# Patient Record
Sex: Male | Born: 1990 | Race: White | Hispanic: No | Marital: Single | Smoking: Current some day smoker
Health system: Southern US, Community
[De-identification: ages and names within clinical notes are randomized; demographics above are authoritative.]

## PROBLEM LIST (undated history)

## (undated) ENCOUNTER — Ambulatory Visit (HOSPITAL_COMMUNITY): Admission: EM | Payer: Self-pay

## (undated) DIAGNOSIS — T50902A Poisoning by unspecified drugs, medicaments and biological substances, intentional self-harm, initial encounter: Secondary | ICD-10-CM

## (undated) DIAGNOSIS — F329 Major depressive disorder, single episode, unspecified: Secondary | ICD-10-CM

## (undated) DIAGNOSIS — I10 Essential (primary) hypertension: Secondary | ICD-10-CM

## (undated) DIAGNOSIS — Z720 Tobacco use: Secondary | ICD-10-CM

## (undated) DIAGNOSIS — Q613 Polycystic kidney, unspecified: Secondary | ICD-10-CM

## (undated) DIAGNOSIS — F32A Depression, unspecified: Secondary | ICD-10-CM

## (undated) HISTORY — PX: NO PAST SURGERIES: SHX2092

---
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
## 2013-05-01 ENCOUNTER — Emergency Department (HOSPITAL_COMMUNITY)
Admission: EM | Admit: 2013-05-01 | Discharge: 2013-05-01 | Disposition: A | Discharge status: Home / Self Care | Payer: Medicaid Other | Attending: Emergency Medicine | Admitting: Emergency Medicine

## 2013-05-01 ENCOUNTER — Encounter (HOSPITAL_COMMUNITY): Payer: Self-pay | Admitting: Emergency Medicine

## 2013-05-01 DIAGNOSIS — F17200 Nicotine dependence, unspecified, uncomplicated: Secondary | ICD-10-CM | POA: Insufficient documentation

## 2013-05-01 DIAGNOSIS — Y92410 Unspecified street and highway as the place of occurrence of the external cause: Secondary | ICD-10-CM | POA: Insufficient documentation

## 2013-05-01 DIAGNOSIS — Y9389 Activity, other specified: Secondary | ICD-10-CM | POA: Insufficient documentation

## 2013-05-01 DIAGNOSIS — Q613 Polycystic kidney, unspecified: Secondary | ICD-10-CM | POA: Insufficient documentation

## 2013-05-01 DIAGNOSIS — IMO0002 Reserved for concepts with insufficient information to code with codable children: Secondary | ICD-10-CM | POA: Insufficient documentation

## 2013-05-01 DIAGNOSIS — T07XXXA Unspecified multiple injuries, initial encounter: Secondary | ICD-10-CM

## 2013-05-01 HISTORY — DX: Polycystic kidney, unspecified: Q61.3
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
## 2013-05-01 NOTE — Discharge Instructions (Signed)
Take tylenol or motrin as needed for pain.  Apply ice to sore joints and heat or ice to sore muscles.  Return to the ER if you develop severe headache, neck/back/chest/abd pain, difficulty breathing, loss of control of bladder/bowels or extremity weakness/numbness.

## 2013-05-01 NOTE — ED Provider Notes (Signed)
Medical screening examination/treatment/procedure(s) were performed by non-physician practitioner and as supervising physician I was immediately available for consultation/collaboration.    ]  [place] L Jessic Standifer, MD 05/01/13 0621 
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
## 2013-05-01 NOTE — ED Notes (Signed)
Patient is alert and oriented x3.  He was given DC instructions and follow up visit instructions.  Patient gave verbal understanding.  He was DC ambulatory under his own power to home.  V/S stable.  He was not showing any signs of distress on DC 
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
## 2014-01-22 ENCOUNTER — Emergency Department (HOSPITAL_COMMUNITY)
Admission: EM | Admit: 2014-01-22 | Discharge: 2014-01-22 | Disposition: A | Discharge status: Home / Self Care | Payer: Medicaid Other | Attending: Emergency Medicine | Admitting: Emergency Medicine

## 2014-01-22 ENCOUNTER — Encounter (HOSPITAL_COMMUNITY): Payer: Self-pay | Admitting: Emergency Medicine

## 2014-01-22 ENCOUNTER — Emergency Department (HOSPITAL_COMMUNITY): Payer: Medicaid Other

## 2014-01-22 DIAGNOSIS — R109 Unspecified abdominal pain: Secondary | ICD-10-CM | POA: Diagnosis present

## 2014-01-22 DIAGNOSIS — Z720 Tobacco use: Secondary | ICD-10-CM | POA: Insufficient documentation

## 2014-01-22 DIAGNOSIS — N23 Unspecified renal colic: Secondary | ICD-10-CM | POA: Insufficient documentation

## 2014-01-22 DIAGNOSIS — Q613 Polycystic kidney, unspecified: Secondary | ICD-10-CM | POA: Diagnosis not present

## 2014-01-22 LAB — URINALYSIS, ROUTINE W REFLEX MICROSCOPIC
Bilirubin Urine: NEGATIVE
Glucose, UA: NEGATIVE mg/dL
Hgb urine dipstick: NEGATIVE
Ketones, ur: NEGATIVE mg/dL
Leukocytes, UA: NEGATIVE
Nitrite: NEGATIVE
Protein, ur: NEGATIVE mg/dL
Specific Gravity, Urine: 1.015 (ref 1.005–1.030)
Urobilinogen, UA: 0.2 mg/dL (ref 0.0–1.0)
pH: 7.5 (ref 5.0–8.0)

## 2014-01-22 LAB — CBC WITH DIFFERENTIAL/PLATELET
Basophils Absolute: 0.1 10*3/uL (ref 0.0–0.1)
Basophils Relative: 1 % (ref 0–1)
Eosinophils Absolute: 0.6 10*3/uL (ref 0.0–0.7)
Eosinophils Relative: 5 % (ref 0–5)
HCT: 46.8 % (ref 39.0–52.0)
Hemoglobin: 16.8 g/dL (ref 13.0–17.0)
Lymphocytes Relative: 53 % — ABNORMAL HIGH (ref 12–46)
Lymphs Abs: 6.5 10*3/uL — ABNORMAL HIGH (ref 0.7–4.0)
MCH: 32.1 pg (ref 26.0–34.0)
MCHC: 35.9 g/dL (ref 30.0–36.0)
MCV: 89.3 fL (ref 78.0–100.0)
Monocytes Absolute: 0.7 10*3/uL (ref 0.1–1.0)
Monocytes Relative: 6 % (ref 3–12)
Neutro Abs: 4.3 10*3/uL (ref 1.7–7.7)
Neutrophils Relative %: 35 % — ABNORMAL LOW (ref 43–77)
Platelets: 301 10*3/uL (ref 150–400)
RBC: 5.24 MIL/uL (ref 4.22–5.81)
RDW: 12 % (ref 11.5–15.5)
WBC: 12.2 10*3/uL — ABNORMAL HIGH (ref 4.0–10.5)

## 2014-01-22 LAB — BASIC METABOLIC PANEL
Anion gap: 10 (ref 5–15)
BUN: 21 mg/dL (ref 6–23)
CO2: 25 mmol/L (ref 19–32)
Calcium: 9.1 mg/dL (ref 8.4–10.5)
Chloride: 105 mEq/L (ref 96–112)
Creatinine, Ser: 1.32 mg/dL (ref 0.50–1.35)
GFR calc Af Amer: 88 mL/min — ABNORMAL LOW (ref >90)
GFR calc non Af Amer: 76 mL/min — ABNORMAL LOW (ref >90)
Glucose, Bld: 133 mg/dL — ABNORMAL HIGH (ref 70–99)
Potassium: 3.7 mmol/L (ref 3.5–5.1)
Sodium: 140 mmol/L (ref 135–145)

## 2014-01-22 LAB — PATHOLOGIST SMEAR REVIEW

## 2014-01-22 MED ORDER — KETOROLAC TROMETHAMINE 15 MG/ML IJ SOLN
15.0000 mg | Freq: Once | INTRAMUSCULAR | Status: AC
  Administered 2014-01-22: 15 mg via INTRAVENOUS
  Filled 2014-01-22: qty 1

## 2014-01-22 MED ORDER — HYDROMORPHONE HCL 1 MG/ML IJ SOLN
1.0000 mg | Freq: Once | INTRAMUSCULAR | Status: AC
  Administered 2014-01-22: 1 mg via INTRAVENOUS
  Filled 2014-01-22: qty 1

## 2014-01-22 MED ORDER — SODIUM CHLORIDE 0.9 % IV BOLUS (SEPSIS)
1000.0000 mL | Freq: Once | INTRAVENOUS | Status: AC
  Administered 2014-01-22: 1000 mL via INTRAVENOUS

## 2014-01-22 MED ORDER — ONDANSETRON HCL 4 MG PO TABS: 4.0000 mg | ORAL_TABLET | Freq: Four times a day (QID) | ORAL | Status: DC

## 2014-01-22 MED ORDER — ONDANSETRON HCL 4 MG/2ML IJ SOLN
4.0000 mg | Freq: Once | INTRAMUSCULAR | Status: AC
  Administered 2014-01-22: 4 mg via INTRAVENOUS
  Filled 2014-01-22: qty 2

## 2014-01-22 MED ORDER — OXYCODONE-ACETAMINOPHEN 5-325 MG PO TABS: 1.0000 | ORAL_TABLET | ORAL | Status: DC | PRN

## 2014-01-22 NOTE — ED Notes (Signed)
Pt c/o right lower abdominal pain starting this morning. Denies back pain. Denies pain radiates to his groin. Took pepto bismol this morning with no alleviation of symptoms. Emesis x2 today. First emesis occurrence patient reports light pink vomit from medicine. Second emesis occurrence reports bright red blood. Has autosomal dominant polycystic kidney disease. Normally takes Enalapril but hasn't had the medication for a while. Patient is pacing around the bed but not actively vomiting. No hx kidney stones. Still has gall bladder and appendix. No surgical history. No other questions/concerns. Awaiting MD/PA.

## 2014-01-22 NOTE — Discharge Instructions (Signed)
Ureteral Colic (Kidney Stones) °Ureteral colic is the result of a condition when kidney stones form inside the kidney. Once kidney stones are formed they may move into the tube that connects the kidney with the bladder (ureter). If this occurs, this condition may cause pain (colic) in the ureter.  °CAUSES  °Pain is caused by stone movement in the ureter and the obstruction caused by the stone. °SYMPTOMS  °The pain comes and goes as the ureter contracts around the stone. The pain is usually intense, sharp, and stabbing in character. The location of the pain may move as the stone moves through the ureter. When the stone is near the kidney the pain is usually located in the back and radiates to the belly (abdomen). When the stone is ready to pass into the bladder the pain is often located in the lower abdomen on the side the stone is located. At this location, the symptoms may mimic those of a urinary tract infection with urinary frequency. Once the stone is located here it often passes into the bladder and the pain disappears completely. °TREATMENT  °· Your caregiver will provide you with medicine for pain relief. °· You may require specialized follow-up X-rays. °· The absence of pain does not always mean that the stone has passed. It may have just stopped moving. If the urine remains completely obstructed, it can cause loss of kidney function or even complete destruction of the involved kidney. It is your responsibility and in your interest that X-rays and follow-ups as suggested by your caregiver are completed. Relief of pain without passage of the stone can be associated with severe damage to the kidney, including loss of kidney function on that side. °· If your stone does not pass on its own, additional measures may be taken by your caregiver to ensure its removal. °HOME CARE INSTRUCTIONS  °· Increase your fluid intake. Water is the preferred fluid since juices containing vitamin C may acidify the urine making it  less likely for certain stones (uric acid stones) to pass. °· Strain all urine. A strainer will be provided. Keep all particulate matter or stones for your caregiver to inspect. °· Take your pain medicine as directed. °· Make a follow-up appointment with your caregiver as directed. °· Remember that the goal is passage of your stone. The absence of pain does not mean the stone is gone. Follow your caregiver's instructions. °· Only take over-the-counter or prescription medicines for pain, discomfort, or fever as directed by your caregiver. °SEEK MEDICAL CARE IF:  °· Pain cannot be controlled with the prescribed medicine. °· You have a fever. °· Pain continues for longer than your caregiver advises it should. °· There is a change in the pain, and you develop chest discomfort or constant abdominal pain. °· You feel faint or pass out. °MAKE SURE YOU:  °· Understand these instructions. °· Will watch your condition. °· Will get help right away if you are not doing well or get worse. °Document Released: 10/01/2004 Document Revised: 04/18/2012 Document Reviewed: 06/18/2010 °[employer]® Patient Information ©2015 [employer]. This information is not intended to replace advice given to you by your health care provider. Make sure you discuss any questions you have with your health care provider. ° °

## 2014-01-22 NOTE — ED Provider Notes (Signed)
CSN: [id number]     Arrival date & time 01/22/14  1118 History   First MD Initiated Contact with Patient 01/22/14 1132     Chief Complaint  Patient presents with  . Abdominal Pain  . Hematemesis     (Consider location/radiation/quality/duration/timing/severity/associated sxs/prior Treatment) HPI   23yM with R sided abdominal/flank pain. Acute onset this morning. Pain sharp/severe. No appreciable exacerbating or relieving factors. Nausea and vomited x2. Small streaks of blood. No coffee grounds. Pain radiates into R testicle. No fever or chills. No hx of similar symptoms. No urinary complaints. Hx of polycystic kidneys.   Past Medical History  Diagnosis Date  . Polycystic kidney disease   . Polycystic kidney disease    History reviewed. No pertinent past surgical history. History reviewed. No pertinent family history. History  Substance Use Topics  . Smoking status: Current Every Day Smoker  . Smokeless tobacco: Not on file  . Alcohol Use: Yes     Comment: occ    Review of Systems All systems reviewed and negative, other than as noted in HPI.   Allergies  Ibuprofen  Home Medications   Prior to Admission medications   Medication Sig Start Date End Date Taking? Authorizing Provider  bismuth subsalicylate (PEPTO BISMOL) 262 MG/15ML suspension Take 30 mLs by mouth every 6 (six) hours as needed for indigestion or diarrhea or loose stools.   Yes Historical Provider, MD   BP 168/107 mmHg  Pulse 80  Temp(Src)   Resp 18  SpO2 100% Physical Exam  Constitutional: He appears well-developed and well-nourished. No distress.  standing uncomfortably beside bed when I entered room.  HENT:  Head: Normocephalic and atraumatic.  Eyes: Conjunctivae are normal. Right eye exhibits no discharge. Left eye exhibits no discharge.  Neck: Neck supple.  Cardiovascular: Normal rate, regular rhythm and normal heart sounds.  Exam reveals no gallop and no friction rub.   No murmur  heard. Pulmonary/Chest: Effort normal and breath sounds normal. No respiratory distress.  Abdominal: Soft. He exhibits no distension. There is no tenderness.  Musculoskeletal: He exhibits no edema or tenderness.  Neurological: He is alert.  Skin: Skin is warm and dry.  Psychiatric: He has a normal mood and affect. His behavior is normal. Thought content normal.  Nursing note and vitals reviewed.   ED Course  Procedures (including critical care time) Labs Review Labs Reviewed  CBC WITH DIFFERENTIAL - Abnormal; Notable for the following:    WBC 12.2 (*)    Neutrophils Relative % 35 (*)    Lymphocytes Relative 53 (*)    Lymphs Abs 6.5 (*)    All other components within normal limits  BASIC METABOLIC PANEL - Abnormal; Notable for the following:    Glucose, Bld 133 (*)    GFR calc non Af Amer 76 (*)    GFR calc Af Amer 88 (*)    All other components within normal limits  URINALYSIS, ROUTINE W REFLEX MICROSCOPIC - Abnormal; Notable for the following:    APPearance CLOUDY (*)    All other components within normal limits  PATHOLOGIST SMEAR REVIEW    Imaging Review Ct Abdomen Pelvis Wo Contrast  01/22/2014   CLINICAL DATA:  Initial encounter for right-sided abdominal pain since this morning. History of polycystic disease.  EXAM: CT ABDOMEN AND PELVIS WITHOUT CONTRAST  TECHNIQUE: Multidetector CT imaging of the abdomen and pelvis was performed following the standard protocol without IV contrast.  COMPARISON:  07/08/2007  FINDINGS: Lower chest:  Unremarkable.  Hepatobiliary: No  focal abnormality in the liver on this study without intravenous contrast. No evidence for hepatomegaly. There is no evidence for gallstones, gallbladder wall thickening, or pericholecystic fluid. No intrahepatic or extrahepatic biliary dilation.  Pancreas: No focal mass lesion. No dilatation of the main duct. No intraparenchymal cyst. No peripancreatic edema.  Spleen: No splenomegaly. No focal mass lesion.   Adrenals/Urinary Tract: No adrenal nodule or mass. 1 mm nonobstructing stone identified in the upper pole of the right kidney. Multiple water density lesions of varying sizes are seen in the kidneys bilaterally. The dominant lesion is again noted in the left kidney measuring approximately 5.1 x 6.2 cm today compared to 4.9 x 5.6 cm previously. On image 27 series 2, a 14 mm hyper attenuating lesion is seen in the interpolar left kidney which may be new since the prior study as it was not definitely seen previously. 2 mm stone is identified in the distal right ureter, 5-10 mm proximal to the right UVJ. No evidence for left ureteral stone. No bladder stone.  Stomach/Bowel: Stomach is nondistended. No gastric wall thickening. No evidence of outlet obstruction. Duodenum is normally positioned as is the ligament of Treitz. No small bowel wall thickening. No small bowel dilatation. Terminal ileum is normal. The appendix is normal. Diverticular changes are noted in the left colon without evidence of diverticulitis.  Vascular/Lymphatic: No abdominal aortic aneurysm. No gastrohepatic or hepatoduodenal ligament lymphadenopathy. No retroperitoneal lymphadenopathy. No evidence for pelvic lymphadenopathy.  Reproductive: Prostate gland and seminal vesicles are unremarkable.  Other: No intraperitoneal free fluid.  Musculoskeletal: Bone windows reveal no worrisome lytic or sclerotic osseous lesions.  IMPRESSION: 2 mm stone identified in distal right ureter, just proximal to the UVJ. This causes mild fullness in the right intrarenal collecting system and ureter without overt hydroureteronephrosis. Other tiny nonobstructing stone identified in the right kidney.  Bilateral renal cysts, seen previously. The largest cyst has increased in the interval. There is a 14 mm hyper attenuating lesion in the interpolar left kidney which is probably a cyst complicated by proteinaceous debris or hemorrhage, but cannot be definitively characterize  as such. Neoplasm would be unlikely in a patient of this age, but followup could be used to ensure stability. If clinically warranted, MRI without and with contrast could be used for definitive characterization.   Electronically Signed   By: Kennith CenterEric  Mansell M.D.   On: 01/22/2014 13:59     EKG Interpretation None      MDM   Final diagnoses:  Right flank pain    23yM with severe R flank pain. Suspect ureteral colic. Basic labs/UA. CT.   CT confirms suspicion. Symptoms now controlled. Continued pain/nausea meds. Avoid further NSAIDS with hx of polycystic kidneys. outpt urology FU as needed.     Raeford RazorStephen Alfonso Shackett, MD 01/23/14 1105

## 2014-05-10 ENCOUNTER — Encounter (HOSPITAL_COMMUNITY): Payer: Self-pay

## 2014-05-10 ENCOUNTER — Emergency Department (HOSPITAL_COMMUNITY)
Admission: EM | Admit: 2014-05-10 | Discharge: 2014-05-10 | Disposition: A | Discharge status: Home / Self Care | Payer: Medicaid Other | Attending: Emergency Medicine | Admitting: Emergency Medicine

## 2014-05-10 ENCOUNTER — Emergency Department (HOSPITAL_COMMUNITY): Payer: Medicaid Other

## 2014-05-10 DIAGNOSIS — Q613 Polycystic kidney, unspecified: Secondary | ICD-10-CM | POA: Insufficient documentation

## 2014-05-10 DIAGNOSIS — Z720 Tobacco use: Secondary | ICD-10-CM | POA: Insufficient documentation

## 2014-05-10 DIAGNOSIS — Z79899 Other long term (current) drug therapy: Secondary | ICD-10-CM | POA: Insufficient documentation

## 2014-05-10 DIAGNOSIS — M7712 Lateral epicondylitis, left elbow: Secondary | ICD-10-CM | POA: Insufficient documentation

## 2014-05-10 MED ORDER — OXYCODONE-ACETAMINOPHEN 5-325 MG PO TABS: 2.0000 | ORAL_TABLET | Freq: Once | ORAL | Status: DC

## 2014-05-10 MED ORDER — HYDROCODONE-ACETAMINOPHEN 5-325 MG PO TABS: 1.0000 | ORAL_TABLET | Freq: Four times a day (QID) | ORAL | Status: DC | PRN

## 2014-05-10 MED ORDER — HYDROCODONE-ACETAMINOPHEN 5-325 MG PO TABS
2.0000 | ORAL_TABLET | Freq: Once | ORAL | Status: AC
  Administered 2014-05-10: 2 via ORAL
  Filled 2014-05-10: qty 2

## 2014-05-10 NOTE — ED Provider Notes (Signed)
TIME SEEN: 8:05 AM  CHIEF COMPLAINT: Left arm pain  HPI: Pt is a 24 y.o. male with history of polycystic kidney disease who is right-hand-dominant who presents emergency department with left arm pain. Reports he woke up this morning with pain in his elbow that shoots up and down his arm. He denies any known injury but states he may have "twisted it wrong". Denies numbness, tingling or focal weakness. Is unable to extend his elbow because of pain. Several had similar symptoms. 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  ROS: See HPI Constitutional: no fever  Eyes: no drainage  ENT: no runny nose   Cardiovascular:  no chest pain  Resp: no SOB  GI: no vomiting GU: no dysuria Integumentary: no rash  Allergy: no hives  Musculoskeletal: no leg swelling  Neurological: no slurred speech ROS otherwise negative  PAST MEDICAL HISTORY/PAST SURGICAL HISTORY:  Past Medical History  Diagnosis Date  . Polycystic kidney disease   . Polycystic kidney disease     MEDICATIONS:  Prior to Admission medications   Medication Sig Start Date End Date Taking? Authorizing Provider  loratadine (CLARITIN) 10 MG tablet Take 10 mg by mouth daily.   Yes Historical Provider, MD  ondansetron (ZOFRAN) 4 MG tablet Take 1 tablet (4 mg total) by mouth every 6 (six) hours. Patient not taking: Reported on 05/10/2014 01/22/14   Raeford RazorStephen Kohut, MD  oxyCODONE-acetaminophen (PERCOCET/ROXICET) 5-325 MG per tablet Take 1-2 tablets by mouth every 4 (four) hours as needed for severe pain. Patient not taking: Reported on 05/10/2014 01/22/14   Raeford RazorStephen Kohut, MD    ALLERGIES:  Allergies  Allergen Reactions  . Ibuprofen Other (See Comments)    Kidney specialist advised him not to take it  . Nsaids     Kidney specialist advised him not to take it     SOCIAL HISTORY:  History  Substance Use Topics  . Smoking status: Current Some Day Smoker    Types: Cigarettes  . Smokeless tobacco: Never Used  . Alcohol Use: Yes     Comment: occ     FAMILY HISTORY: Family History  Problem Relation Age of Onset  . Family history unknown: Yes    EXAM: BP 140/97 mmHg  Pulse 92  Temp(Src) 97.4 F (36.3 C) (Oral)  Resp 16 CONSTITUTIONAL: Alert and oriented and responds appropriately to questions.  well-nourished, tearful, appears uncomfortable but is nontoxic HEAD: Normocephalic EYES: Conjunctivae clear, PERRL ENT: normal nose; no rhinorrhea; moist mucous membranes; pharynx without lesions noted NECK: Supple, no meningismus, no LAD  CARD: RRR; S1 and S2 appreciated; no murmurs, no clicks, no rubs, no gallops RESP: Normal chest excursion without splinting or tachypnea; breath sounds clear and equal bilaterally; no wheezes, no rhonchi, no rales, no hypoxia or respiratory distress, speaking full sentences ABD/GI: Normal bowel sounds; non-distended; soft, non-tender, no rebound, no guarding, no peritoneal signs BACK:  The back appears normal and is non-tender to palpation, there is no CVA tenderness EXT: Tender to palpation from the humerus to mid forearm without obvious bony deformity but there is swelling around the elbow without ecchymosis on the left side, no erythema or warmth, unable to fully extend the elbow and keeps it at 90 secondary to pain, no bony tenderness over the shoulder or left hand or wrist, 2+ radial pulses bilaterally, normal grip strength bilaterally, compartments are soft, otherwise Normal ROM in all joints; otherwise extremities are non-tender to palpation; no edema; normal capillary refill; no cyanosis, no calf tenderness or swelling  SKIN: Normal color for age and race; warm NEURO: Moves all extremities equally, sensation to light touch intact diffusely, cranial nerves II through XII intact PSYCH: The patient's mood and manner are appropriate. Grooming and personal hygiene are appropriate.  MEDICAL DECISION MAKING: Patient here with left sided arm pain. Denies any known injury but does have bony tenderness on  exam. Will obtain x-rays. No sign of infection, compartment syndrome. Norvasc intact distally. Suspect if x-rays are negative that this is likely tendinitis. Will treat with Vicodin. Will avoid NSAIDs given his history of polycystic kidney disease.  ED PROGRESS: Patient reports his pain is improved. He is now able to slightly extend his arm to 120 degrees.  On reexamination patient is now localized his tenderness to the lateral epicondyle. Suspect this is lateral epicondylitis. Patient unfortunately cannot take anti-inflammatories. We'll discharge with Vicodin and instructions for rest, ice and orthopedic follow-up if symptoms continue. We'll provide work note. Discussed return precautions. He verbalizes understanding and is comfortable with plan.     Layla MawKristen N Ahlana Slaydon, DO 05/10/14 [phone]

## 2014-05-10 NOTE — ED Notes (Signed)
Patient transported to X-ray 

## 2014-05-10 NOTE — Discharge Instructions (Signed)
Tennis Elbow Your caregiver has diagnosed you with a condition often referred to as "tennis elbow." This results from small tears or soreness (inflammation) at the start (origin) of the extensor muscles of the forearm. Although the condition is often called tennis or golfer's elbow, it is caused by any repetitive action performed by your elbow. HOME CARE INSTRUCTIONS  If the condition has been short lived, rest may be the only treatment required. Using your opposite hand or arm to perform the task may help. Even changing your grip may help rest the extremity. These may even prevent the condition from recurring.  Longer standing problems, however, will often be relieved faster by:  Using anti-inflammatory agents.  Applying ice packs for 30 minutes at the end of the working day, at bed time, or when activities are finished.  Your caregiver may also have you wear a splint or sling. This will allow the inflamed tendon to heal. At times, steroid injections aided with a local anesthetic will be required along with splinting for 1 to 2 weeks. Two to three steroid injections will often solve the problem. In some long standing cases, the inflamed tendon does not respond to conservative (non-surgical) therapy. Then surgery may be required to repair it. MAKE SURE YOU:   Understand these instructions.  Will watch your condition.  Will get help right away if you are not doing well or get worse. Document Released: 12/22/2004 Document Revised: 03/16/2011 Document Reviewed: 08/10/2007 [facility] Patient Information 2015 [place], [place]. This information is not intended to replace advice given to you by your health care provider. Make sure you discuss any questions you have with your health care provider.  Lateral Epicondylitis (Tennis Elbow) with Rehab Lateral epicondylitis involves inflammation and pain around the outer portion of the elbow. The pain is caused by inflammation of the tendons in the forearm  that bring back (extend) the wrist. Lateral epicondylitis is also called tennis elbow, because it is very common in tennis players. However, it may occur in any individual who extends the wrist repetitively. If lateral epicondylitis is left untreated, it may become a chronic problem. SYMPTOMS   Pain, tenderness, and inflammation on the outer (lateral) side of the elbow.  Pain or weakness with gripping activities.  Pain that increases with wrist-twisting motions (playing tennis, using a screwdriver, opening a door or a jar).  Pain with lifting objects, including a coffee cup. CAUSES  Lateral epicondylitis is caused by inflammation of the tendons that extend the wrist. Causes of injury may include:  Repetitive stress and strain on the muscles and tendons that extend the wrist.  Sudden change in activity level or intensity.  Incorrect grip in racquet sports.  Incorrect grip size of racquet (often too large).  Incorrect hitting position or technique (usually backhand, leading with the elbow).  Using a racket that is too heavy. RISK INCREASES WITH:  Sports or occupations that require repetitive and/or strenuous forearm and wrist movements (tennis, squash, racquetball, carpentry).  Poor wrist and forearm strength and flexibility.  Failure to warm up properly before activity.  Resuming activity before healing, rehabilitation, and conditioning are complete. PREVENTION   Warm up and stretch properly before activity.  Maintain physical fitness:  Strength, flexibility, and endurance.  Cardiovascular fitness.  Wear and use properly fitted equipment.  Learn and use proper technique and have a coach correct improper technique.  Wear a tennis elbow (counterforce) brace. PROGNOSIS  The course of this condition depends on the degree of the injury. If treated  properly, acute cases (symptoms lasting less than 4 weeks) are often resolved in 2 to 6 weeks. Chronic (longer lasting cases)  often resolve in 3 to 6 months but may require physical therapy. RELATED COMPLICATIONS   Frequently recurring symptoms, resulting in a chronic problem. Properly treating the problem the first time decreases frequency of recurrence.  Chronic inflammation, scarring tendon degeneration, and partial tendon tear, requiring surgery.  Delayed healing or resolution of symptoms. TREATMENT  Treatment first involves the use of ice and medicine to reduce pain and inflammation. Strengthening and stretching exercises may help reduce discomfort if performed regularly. These exercises may be performed at home if the condition is an acute injury. Chronic cases may require a referral to a physical therapist for evaluation and treatment. Your caregiver may advise a corticosteroid injection to help reduce inflammation. Rarely, surgery is needed. MEDICATION  If pain medicine is needed, nonsteroidal anti-inflammatory medicines (aspirin and ibuprofen), or other minor pain relievers (acetaminophen), are often advised.  Do not take pain medicine for 7 days before surgery.  Prescription pain relievers may be given, if your caregiver thinks they are needed. Use only as directed and only as much as you need.  Corticosteroid injections may be recommended. These injections should be reserved only for the most severe cases, because they can only be given a certain number of times. HEAT AND COLD  Cold treatment (icing) should be applied for 10 to 15 minutes every 2 to 3 hours for inflammation and pain, and immediately after activity that aggravates your symptoms. Use ice packs or an ice massage.  Heat treatment may be used before performing stretching and strengthening activities prescribed by your caregiver, physical therapist, or athletic trainer. Use a heat pack or a warm water soak. SEEK MEDICAL CARE IF: Symptoms get worse or do not improve in 2 weeks, despite treatment. EXERCISES  RANGE OF MOTION (ROM) AND  STRETCHING EXERCISES - Epicondylitis, Lateral (Tennis Elbow) These exercises may help you when beginning to rehabilitate your injury. Your symptoms may go away with or without further involvement from your physician, physical therapist, or athletic trainer. While completing these exercises, remember:   Restoring tissue flexibility helps normal motion to return to the joints. This allows healthier, less painful movement and activity.  An effective stretch should be held for at least 30 seconds.  A stretch should never be painful. You should only feel a gentle lengthening or release in the stretched tissue. RANGE OF MOTION - Wrist Flexion, Active-Assisted  Extend your right / left elbow with your fingers pointing down.*  Gently pull the back of your hand towards you, until you feel a gentle stretch on the top of your forearm.  Hold this position for __________ seconds. Repeat __________ times. Complete this exercise __________ times per day.  *If directed by your physician, physical therapist or athletic trainer, complete this stretch with your elbow bent, rather than extended. RANGE OF MOTION - Wrist Extension, Active-Assisted  Extend your right / left elbow and turn your palm upwards.*  Gently pull your palm and fingertips back, so your wrist extends and your fingers point more toward the ground.  You should feel a gentle stretch on the inside of your forearm.  Hold this position for __________ seconds. Repeat __________ times. Complete this exercise __________ times per day. *If directed by your physician, physical therapist or athletic trainer, complete this stretch with your elbow bent, rather than extended. STRETCH - Wrist Flexion  Place the back of your right / left  hand on a tabletop, leaving your elbow slightly bent. Your fingers should point away from your body.  Gently press the back of your hand down onto the table by straightening your elbow. You should feel a stretch on  the top of your forearm.  Hold this position for __________ seconds. Repeat __________ times. Complete this stretch __________ times per day.  STRETCH - Wrist Extension   Place your right / left fingertips on a tabletop, leaving your elbow slightly bent. Your fingers should point backwards.  Gently press your fingers and palm down onto the table by straightening your elbow. You should feel a stretch on the inside of your forearm.  Hold this position for __________ seconds. Repeat __________ times. Complete this stretch __________ times per day.  STRENGTHENING EXERCISES - Epicondylitis, Lateral (Tennis Elbow) These exercises may help you when beginning to rehabilitate your injury. They may resolve your symptoms with or without further involvement from your physician, physical therapist, or athletic trainer. While completing these exercises, remember:   Muscles can gain both the endurance and the strength needed for everyday activities through controlled exercises.  Complete these exercises as instructed by your physician, physical therapist or athletic trainer. Increase the resistance and repetitions only as guided.  You may experience muscle soreness or fatigue, but the pain or discomfort you are trying to eliminate should never worsen during these exercises. If this pain does get worse, stop and make sure you are following the directions exactly. If the pain is still present after adjustments, discontinue the exercise until you can discuss the trouble with your caregiver. STRENGTH - Wrist Flexors  Sit with your right / left forearm palm-up and fully supported on a table or countertop. Your elbow should be resting below the height of your shoulder. Allow your wrist to extend over the edge of the surface.  Loosely holding a __________ weight, or a piece of rubber exercise band or tubing, slowly curl your hand up toward your forearm.  Hold this position for __________ seconds. Slowly lower  the wrist back to the starting position in a controlled manner. Repeat __________ times. Complete this exercise __________ times per day.  STRENGTH - Wrist Extensors  Sit with your right / left forearm palm-down and fully supported on a table or countertop. Your elbow should be resting below the height of your shoulder. Allow your wrist to extend over the edge of the surface.  Loosely holding a __________ weight, or a piece of rubber exercise band or tubing, slowly curl your hand up toward your forearm.  Hold this position for __________ seconds. Slowly lower the wrist back to the starting position in a controlled manner. Repeat __________ times. Complete this exercise __________ times per day.  STRENGTH - Ulnar Deviators  Stand with a ____________________ weight in your right / left hand, or sit while holding a rubber exercise band or tubing, with your healthy arm supported on a table or countertop.  Move your wrist, so that your pinkie travels toward your forearm and your thumb moves away from your forearm.  Hold this position for __________ seconds and then slowly lower the wrist back to the starting position. Repeat __________ times. Complete this exercise __________ times per day STRENGTH - Radial Deviators  Stand with a ____________________ weight in your right / left hand, or sit while holding a rubber exercise band or tubing, with your injured arm supported on a table or countertop.  Raise your hand upward in front of you or pull up  on the rubber tubing.  Hold this position for __________ seconds and then slowly lower the wrist back to the starting position. Repeat __________ times. Complete this exercise __________ times per day. STRENGTH - Forearm Supinators   Sit with your right / left forearm supported on a table, keeping your elbow below shoulder height. Rest your hand over the edge, palm down.  Gently grip a hammer or a soup ladle.  Without moving your elbow, slowly turn  your palm and hand upward to a "thumbs-up" position.  Hold this position for __________ seconds. Slowly return to the starting position. Repeat __________ times. Complete this exercise __________ times per day.  STRENGTH - Forearm Pronators   Sit with your right / left forearm supported on a table, keeping your elbow below shoulder height. Rest your hand over the edge, palm up.  Gently grip a hammer or a soup ladle.  Without moving your elbow, slowly turn your palm and hand upward to a "thumbs-up" position.  Hold this position for __________ seconds. Slowly return to the starting position. Repeat __________ times. Complete this exercise __________ times per day.  STRENGTH - Grip  Grasp a tennis ball, a dense sponge, or a large, rolled sock in your hand.  Squeeze as hard as you can, without increasing any pain.  Hold this position for __________ seconds. Release your grip slowly. Repeat __________ times. Complete this exercise __________ times per day.  STRENGTH - Elbow Extensors, Isometric  Stand or sit upright, on a firm surface. Place your right / left arm so that your palm faces your stomach, and it is at the height of your waist.  Place your opposite hand on the underside of your forearm. Gently push up as your right / left arm resists. Push as hard as you can with both arms, without causing any pain or movement at your right / left elbow. Hold this stationary position for __________ seconds. Gradually release the tension in both arms. Allow your muscles to relax completely before repeating. Document Released: 12/22/2004 Document Revised: 05/08/2013 Document Reviewed: 04/05/2008 [facility] Patient Information 2015 [place], [place]. This information is not intended to replace advice given to you by your health care provider. Make sure you discuss any questions you have with your health care provider.  RICE: Routine Care for Injuries The routine care of many injuries includes Rest,  Ice, Compression, and Elevation (RICE). HOME CARE INSTRUCTIONS  Rest is needed to allow your body to heal. Routine activities can usually be resumed when comfortable. Injured tendons and bones can take up to 6 weeks to heal. Tendons are the cord-like structures that attach muscle to bone.  Ice following an injury helps keep the swelling down and reduces pain.  Put ice in a plastic bag.  Place a towel between your skin and the bag.  Leave the ice on for 15-20 minutes, 3-4 times a day, or as directed by your health care provider. Do this while awake, for the first 24 to 48 hours. After that, continue as directed by your caregiver.  Compression helps keep swelling down. It also gives support and helps with discomfort. If an elastic bandage has been applied, it should be removed and reapplied every 3 to 4 hours. It should not be applied tightly, but firmly enough to keep swelling down. Watch fingers or toes for swelling, bluish discoloration, coldness, numbness, or excessive pain. If any of these problems occur, remove the bandage and reapply loosely. Contact your caregiver if these problems continue.  Elevation  helps reduce swelling and decreases pain. With extremities, such as the arms, hands, legs, and feet, the injured area should be placed near or above the level of the heart, if possible. SEEK IMMEDIATE MEDICAL CARE IF:  You have persistent pain and swelling.  You develop redness, numbness, or unexpected weakness.  Your symptoms are getting worse rather than improving after several days. These symptoms may indicate that further evaluation or further X-rays are needed. Sometimes, X-rays may not show a small broken bone (fracture) until 1 week or 10 days later. Make a follow-up appointment with your caregiver. Ask when your X-ray results will be ready. Make sure you get your X-ray results. Document Released: 04/05/2000 Document Revised: 12/27/2012 Document Reviewed: 05/23/2010 [facility]  Patient Information 2015 [place], [place]. This information is not intended to replace advice given to you by your health care provider. Make sure you discuss any questions you have with your health care provider.

## 2014-05-10 NOTE — ED Notes (Signed)
Patient states that he woke at 0700 today and had left arm pain. Patient states he does not know what happened to his arm. Patient c/o pain in the left elbow area. Patient tearful, but very vague about injury.

## 2014-08-29 ENCOUNTER — Encounter (HOSPITAL_COMMUNITY): Payer: Self-pay | Admitting: *Deleted

## 2014-08-29 ENCOUNTER — Emergency Department (HOSPITAL_COMMUNITY)
Admission: EM | Admit: 2014-08-29 | Discharge: 2014-08-29 | Disposition: A | Discharge status: Home / Self Care | Payer: Medicaid Other | Attending: Emergency Medicine | Admitting: Emergency Medicine

## 2014-08-29 DIAGNOSIS — R1013 Epigastric pain: Secondary | ICD-10-CM

## 2014-08-29 DIAGNOSIS — Z79899 Other long term (current) drug therapy: Secondary | ICD-10-CM | POA: Insufficient documentation

## 2014-08-29 DIAGNOSIS — R1110 Vomiting, unspecified: Secondary | ICD-10-CM | POA: Insufficient documentation

## 2014-08-29 DIAGNOSIS — R197 Diarrhea, unspecified: Secondary | ICD-10-CM | POA: Insufficient documentation

## 2014-08-29 DIAGNOSIS — Q613 Polycystic kidney, unspecified: Secondary | ICD-10-CM | POA: Insufficient documentation

## 2014-08-29 DIAGNOSIS — Z720 Tobacco use: Secondary | ICD-10-CM | POA: Insufficient documentation

## 2014-08-29 LAB — DIFFERENTIAL
BASOS PCT: 0 % (ref 0–1)
Basophils Absolute: 0 10*3/uL (ref 0.0–0.1)
EOS ABS: 0 10*3/uL (ref 0.0–0.7)
EOS PCT: 0 % (ref 0–5)
LYMPHS ABS: 1.8 10*3/uL (ref 0.7–4.0)
Lymphocytes Relative: 12 % (ref 12–46)
MONOS PCT: 7 % (ref 3–12)
Monocytes Absolute: 1.2 10*3/uL — ABNORMAL HIGH (ref 0.1–1.0)
NEUTROS PCT: 81 % — AB (ref 43–77)
Neutro Abs: 12.4 10*3/uL — ABNORMAL HIGH (ref 1.7–7.7)

## 2014-08-29 LAB — COMPREHENSIVE METABOLIC PANEL
ALT: 24 U/L (ref 17–63)
AST: 21 U/L (ref 15–41)
Albumin: 4.9 g/dL (ref 3.5–5.0)
Alkaline Phosphatase: 55 U/L (ref 38–126)
Anion gap: 12 (ref 5–15)
BUN: 18 mg/dL (ref 6–20)
CO2: 27 mmol/L (ref 22–32)
Calcium: 10.5 mg/dL — ABNORMAL HIGH (ref 8.9–10.3)
Chloride: 99 mmol/L — ABNORMAL LOW (ref 101–111)
Creatinine, Ser: 0.86 mg/dL (ref 0.61–1.24)
GFR calc Af Amer: >60 mL/min (ref >60)
GFR calc non Af Amer: >60 mL/min (ref >60)
Glucose, Bld: 128 mg/dL — ABNORMAL HIGH (ref 65–99)
Potassium: 3.7 mmol/L (ref 3.5–5.1)
Sodium: 138 mmol/L (ref 135–145)
Total Bilirubin: 1.2 mg/dL (ref 0.3–1.2)
Total Protein: 8.1 g/dL (ref 6.5–8.1)

## 2014-08-29 LAB — CBC
HCT: 51 % (ref 39.0–52.0)
Hemoglobin: 18.6 g/dL — ABNORMAL HIGH (ref 13.0–17.0)
MCH: 32.1 pg (ref 26.0–34.0)
MCHC: 36.5 g/dL — ABNORMAL HIGH (ref 30.0–36.0)
MCV: 88.1 fL (ref 78.0–100.0)
Platelets: 306 10*3/uL (ref 150–400)
RBC: 5.79 MIL/uL (ref 4.22–5.81)
RDW: 11.9 % (ref 11.5–15.5)
WBC: 15.2 10*3/uL — ABNORMAL HIGH (ref 4.0–10.5)

## 2014-08-29 LAB — LIPASE, BLOOD: Lipase: 23 U/L (ref 22–51)

## 2014-08-29 MED ORDER — PROMETHAZINE HCL 25 MG RE SUPP: 25.0000 mg | Freq: Four times a day (QID) | RECTAL | Status: DC | PRN

## 2014-08-29 MED ORDER — GI COCKTAIL ~~LOC~~
30.0000 mL | Freq: Once | ORAL | Status: AC
  Administered 2014-08-29: 30 mL via ORAL
  Filled 2014-08-29: qty 30

## 2014-08-29 MED ORDER — MORPHINE SULFATE (PF) 4 MG/ML IV SOLN
4.0000 mg | Freq: Once | INTRAVENOUS | Status: AC
  Administered 2014-08-29: 4 mg via INTRAVENOUS
  Filled 2014-08-29: qty 1

## 2014-08-29 MED ORDER — ONDANSETRON HCL 4 MG/2ML IJ SOLN
4.0000 mg | Freq: Once | INTRAMUSCULAR | Status: AC | PRN
  Administered 2014-08-29: 4 mg via INTRAVENOUS
  Filled 2014-08-29: qty 2

## 2014-08-29 MED ORDER — HYDROCODONE-ACETAMINOPHEN 5-325 MG PO TABS: 1.0000 | ORAL_TABLET | ORAL | Status: DC | PRN

## 2014-08-29 NOTE — ED Notes (Signed)
Pt provided with pillow and blanket and reassured that MD would be around ASAP.

## 2014-08-29 NOTE — ED Notes (Signed)
Patient was alert, oriented and stable upon discharge. RN went over AVS and patient had no further questions.  

## 2014-08-29 NOTE — ED Notes (Signed)
Message left for PA that patient's pain has not changed after GI cocktail.

## 2014-08-29 NOTE — ED Provider Notes (Signed)
CSN: [id number]     Arrival date & time 08/29/14  1658 History   First MD Initiated Contact with Patient 08/29/14 2057     Chief Complaint  Patient presents with  . Abdominal Pain  . Emesis  . Diarrhea     (Consider location/radiation/quality/duration/timing/severity/associated sxs/prior Treatment) Patient is a 24 y.o. male presenting with abdominal pain, vomiting, and diarrhea. The history is provided by the patient. No language interpreter was used.  Abdominal Pain Pain location:  Epigastric Pain quality: sharp and stabbing   Pain radiates to:  Does not radiate Pain severity:  Moderate Onset quality:  Gradual Timing:  Intermittent Context: not alcohol use, not diet changes and not eating   Associated symptoms: diarrhea and vomiting   Associated symptoms: no chills and no fever   Associated symptoms comment:  Epigastric pain since yesterday no better with medications prescribed of Protonix and Zofran. No fever, hematemesis. It is no worse with eating. He denies previous similar symptoms. He denies alcohol use and does not smoke. No SOB, cough or chest pain.  Emesis Associated symptoms: abdominal pain and diarrhea   Associated symptoms: no chills   Diarrhea Associated symptoms: abdominal pain and vomiting   Associated symptoms: no chills and no fever     Past Medical History  Diagnosis Date  . Polycystic kidney disease   . Polycystic kidney disease    History reviewed. No pertinent past surgical history. Family History  Problem Relation Age of Onset  . Family history unknown: Yes   Social History  Substance Use Topics  . Smoking status: Current Some Day Smoker    Types: Cigarettes  . Smokeless tobacco: Never Used  . Alcohol Use: Yes     Comment: occ    Review of Systems  Constitutional: Negative for fever and chills.  HENT: Negative.   Respiratory: Negative.   Cardiovascular: Negative.   Gastrointestinal: Positive for vomiting, abdominal pain and diarrhea.   Musculoskeletal: Negative.   Skin: Negative.   Neurological: Negative.       Allergies  Ibuprofen and Nsaids  Home Medications   Prior to Admission medications   Medication Sig Start Date End Date Taking? Authorizing Provider  enalapril (VASOTEC) 5 MG tablet Take 5 mg by mouth daily. 05/22/14  Yes Historical Provider, MD  HYDROcodone-acetaminophen (NORCO/VICODIN) 5-325 MG per tablet Take 1-2 tablets by mouth every 6 (six) hours as needed. Patient not taking: Reported on 08/29/2014 05/10/14   Kristen N Ward, DO  ondansetron (ZOFRAN) 4 MG tablet Take 1 tablet (4 mg total) by mouth every 6 (six) hours. Patient not taking: Reported on 05/10/2014 01/22/14   Raeford Razor, MD  oxyCODONE-acetaminophen (PERCOCET/ROXICET) 5-325 MG per tablet Take 1-2 tablets by mouth every 4 (four) hours as needed for severe pain. Patient not taking: Reported on 05/10/2014 01/22/14   Raeford Razor, MD   BP 121/82 mmHg  Pulse 66  Temp(Src) 97.7 F (36.5 C) (Oral)  Resp 18  SpO2 99% Physical Exam  Constitutional: He is oriented to person, place, and time. He appears well-developed and well-nourished.  HENT:  Head: Normocephalic.  Neck: Normal range of motion. Neck supple.  Cardiovascular: Normal rate and regular rhythm.   Pulmonary/Chest: Effort normal and breath sounds normal.  Abdominal: Soft. Bowel sounds are normal. There is tenderness. There is no rebound and no guarding.  Tenderness limited to the epigastrium. Soft abdomen. No RUQ tenderness.   Musculoskeletal: Normal range of motion.  Neurological: He is alert and oriented to person, place, and time.  Skin: Skin is warm and dry. No rash noted.  Psychiatric: He has a normal mood and affect.    ED Course  Procedures (including critical care time) Labs Review Labs Reviewed  COMPREHENSIVE METABOLIC PANEL - Abnormal; Notable for the following:    Chloride 99 (*)    Glucose, Bld 128 (*)    Calcium 10.5 (*)    All other components within normal limits   CBC - Abnormal; Notable for the following:    WBC 15.2 (*)    Hemoglobin 18.6 (*)    MCHC 36.5 (*)    All other components within normal limits  DIFFERENTIAL - Abnormal; Notable for the following:    Neutrophils Relative % 81 (*)    Neutro Abs 12.4 (*)    Monocytes Absolute 1.2 (*)    All other components within normal limits  LIPASE, BLOOD  URINALYSIS, ROUTINE W REFLEX MICROSCOPIC (NOT AT [facility])    Imaging Review No results found. I have personally reviewed and evaluated these images and lab results as part of my medical decision-making.   EKG Interpretation None      MDM   Final diagnoses:  None    1. Epigastric pain  He feels better with Morphine and Zofran. Tolerating PO fluids, declines any solids. VSS in otherwise healthy 24-yo. No regular NSAID use. Will refer to GI for further evaluation of epigastric pain.    Elpidio Anis, PA-C 08/29/14 2247  Raeford Razor, MD 08/30/14 [phone]

## 2014-08-29 NOTE — ED Notes (Signed)
Pt info could not give urine sample at this time because he have yet to have anything to drink. Everything he had came right up.

## 2014-08-29 NOTE — Discharge Instructions (Signed)
CONTINUE TAKING PROTONIX PRESCRIBED BY YOUR DOCTOR. TAKE NORCO FOR PAIN AS NEEDED, PHENERGAN FOR NAUSEA. CLAL THE GASTROENTEROLOGY DOCTOR TO SCHEDULE AN APPOINTMENT FOR FURTHER EVALAUTION OF EPIGASTRIC PAIN.

## 2014-08-29 NOTE — ED Notes (Addendum)
Melvenia Beam, PA at bedside. Pt provided with ginger ale for PO challenge.

## 2014-08-29 NOTE — ED Notes (Addendum)
Pt complains of n/v/d/upper abdominal pain since yesterday morning. Pt went to his PCP and prescribed pantoprazole and zofran. Pt states he does not feel better and was told to come to ED.

## 2014-09-21 ENCOUNTER — Encounter (HOSPITAL_COMMUNITY): Payer: Self-pay | Admitting: Emergency Medicine

## 2014-09-21 ENCOUNTER — Emergency Department (HOSPITAL_COMMUNITY)
Admission: EM | Admit: 2014-09-21 | Discharge: 2014-09-21 | Disposition: A | Discharge status: Home / Self Care | Payer: Self-pay | Attending: Emergency Medicine | Admitting: Emergency Medicine

## 2014-09-21 ENCOUNTER — Emergency Department (HOSPITAL_COMMUNITY): Payer: Medicaid Other

## 2014-09-21 ENCOUNTER — Emergency Department (HOSPITAL_COMMUNITY): Payer: Self-pay

## 2014-09-21 DIAGNOSIS — Z720 Tobacco use: Secondary | ICD-10-CM | POA: Insufficient documentation

## 2014-09-21 DIAGNOSIS — Z79899 Other long term (current) drug therapy: Secondary | ICD-10-CM | POA: Insufficient documentation

## 2014-09-21 DIAGNOSIS — Y998 Other external cause status: Secondary | ICD-10-CM | POA: Insufficient documentation

## 2014-09-21 DIAGNOSIS — Y9289 Other specified places as the place of occurrence of the external cause: Secondary | ICD-10-CM | POA: Insufficient documentation

## 2014-09-21 DIAGNOSIS — R55 Syncope and collapse: Secondary | ICD-10-CM | POA: Insufficient documentation

## 2014-09-21 DIAGNOSIS — Y9389 Activity, other specified: Secondary | ICD-10-CM | POA: Insufficient documentation

## 2014-09-21 DIAGNOSIS — S060X9A Concussion with loss of consciousness of unspecified duration, initial encounter: Secondary | ICD-10-CM | POA: Insufficient documentation

## 2014-09-21 DIAGNOSIS — J069 Acute upper respiratory infection, unspecified: Secondary | ICD-10-CM | POA: Insufficient documentation

## 2014-09-21 DIAGNOSIS — R5383 Other fatigue: Secondary | ICD-10-CM | POA: Insufficient documentation

## 2014-09-21 DIAGNOSIS — W01198A Fall on same level from slipping, tripping and stumbling with subsequent striking against other object, initial encounter: Secondary | ICD-10-CM | POA: Insufficient documentation

## 2014-09-21 DIAGNOSIS — R51 Headache: Secondary | ICD-10-CM

## 2014-09-21 DIAGNOSIS — R519 Headache, unspecified: Secondary | ICD-10-CM

## 2014-09-21 DIAGNOSIS — R4182 Altered mental status, unspecified: Secondary | ICD-10-CM | POA: Insufficient documentation

## 2014-09-21 LAB — CBC WITH DIFFERENTIAL/PLATELET
BASOS PCT: 0 %
Basophils Absolute: 0 10*3/uL (ref 0.0–0.1)
Eosinophils Absolute: 0.1 10*3/uL (ref 0.0–0.7)
Eosinophils Relative: 1 %
HEMATOCRIT: 42.3 % (ref 39.0–52.0)
Hemoglobin: 14.9 g/dL (ref 13.0–17.0)
LYMPHS ABS: 1.5 10*3/uL (ref 0.7–4.0)
Lymphocytes Relative: 10 %
MCH: 31.3 pg (ref 26.0–34.0)
MCHC: 35.2 g/dL (ref 30.0–36.0)
MCV: 88.9 fL (ref 78.0–100.0)
MONO ABS: 1.4 10*3/uL — AB (ref 0.1–1.0)
Monocytes Relative: 9 %
Neutro Abs: 12.3 10*3/uL — ABNORMAL HIGH (ref 1.7–7.7)
Neutrophils Relative %: 80 %
Platelets: 223 10*3/uL (ref 150–400)
RBC: 4.76 MIL/uL (ref 4.22–5.81)
RDW: 12 % (ref 11.5–15.5)
WBC: 15.2 10*3/uL — ABNORMAL HIGH (ref 4.0–10.5)

## 2014-09-21 LAB — BASIC METABOLIC PANEL
Anion gap: 8 (ref 5–15)
BUN: 14 mg/dL (ref 6–20)
CO2: 26 mmol/L (ref 22–32)
CREATININE: 0.95 mg/dL (ref 0.61–1.24)
Calcium: 9.1 mg/dL (ref 8.9–10.3)
Chloride: 105 mmol/L (ref 101–111)
GFR calc non Af Amer: >60 mL/min (ref >60)
GLUCOSE: 107 mg/dL — AB (ref 65–99)
Potassium: 4.1 mmol/L (ref 3.5–5.1)
Sodium: 139 mmol/L (ref 135–145)

## 2014-09-21 MED ORDER — ACETAMINOPHEN 325 MG PO TABS
650.0000 mg | ORAL_TABLET | Freq: Once | ORAL | Status: AC
  Administered 2014-09-21: 650 mg via ORAL
  Filled 2014-09-21: qty 2

## 2014-09-21 NOTE — ED Provider Notes (Signed)
CSN: [id number]     Arrival date & time 09/21/14  1035 History   First MD Initiated Contact with Patient 09/21/14 1056     Chief Complaint  Patient presents with  . Head Injury  . Altered Mental Status   HPI  Mitchell Mcdonald is a 24 year old male with PMHx of polycystic kidney disease presenting with head injury and cough. Pt states he was getting out of his car 2 days ago when he hit his head on the frame of the door and lost consciousness. He is unsure for how long he was out but woke on the ground. States that he does not remember hitting his head but his girlfriend told him this is what happened. Pt reports he was intoxicated at the time. He reports that he has had a constant, throbbing generalized headache since this event. He is also complaining of feeling lightheaded when he walks. Pt is also complaining of congestion, cough, fatigue and body aches that started yesterday. He reports that he began coughing up green sputum with blood mixed in. He has felt feverish since yesterday. Denies sore throat but states sometimes it is painful when he swallows. He has not tried anything for symptom relief. Denies vision changes, drowsiness, gait instability, chest pain, SOB, wheezing, abdominal pain, nausea, vomiting, diarrhea, dysuria, joint aching, numbness or weakness in extremities.   Past Medical History  Diagnosis Date  . Polycystic kidney disease   . Polycystic kidney disease    History reviewed. No pertinent past surgical history. Family History  Problem Relation Age of Onset  . Family history unknown: Yes   Social History  Substance Use Topics  . Smoking status: Current Some Day Smoker    Types: Cigarettes  . Smokeless tobacco: Never Used  . Alcohol Use: Yes     Comment: occ    Review of Systems  Constitutional: Positive for fever, diaphoresis and fatigue. Negative for chills.  HENT: Positive for congestion. Negative for rhinorrhea, sore throat and trouble swallowing.   Eyes: Negative  for visual disturbance.  Respiratory: Positive for cough. Negative for shortness of breath and wheezing.   Cardiovascular: Negative for chest pain.  Gastrointestinal: Negative for nausea, vomiting, abdominal pain and diarrhea.  Genitourinary: Negative for dysuria.  Musculoskeletal: Positive for myalgias. Negative for back pain, arthralgias and gait problem.  Skin: Negative for wound.  Neurological: Positive for syncope, light-headedness and headaches. Negative for seizures and weakness.      Allergies  Ibuprofen and Nsaids  Home Medications   Prior to Admission medications   Medication Sig Start Date End Date Taking? Authorizing Provider  enalapril (VASOTEC) 5 MG tablet Take 5 mg by mouth daily. 05/22/14  Yes Historical Provider, MD  HYDROcodone-acetaminophen (NORCO/VICODIN) 5-325 MG per tablet Take 1-2 tablets by mouth every 4 (four) hours as needed. Patient not taking: Reported on 09/21/2014 08/29/14   Elpidio Anis, PA-C  ondansetron (ZOFRAN) 4 MG tablet Take 1 tablet (4 mg total) by mouth every 6 (six) hours. Patient not taking: Reported on 05/10/2014 01/22/14   Raeford Razor, MD  oxyCODONE-acetaminophen (PERCOCET/ROXICET) 5-325 MG per tablet Take 1-2 tablets by mouth every 4 (four) hours as needed for severe pain. Patient not taking: Reported on 05/10/2014 01/22/14   Raeford Razor, MD  promethazine (PHENERGAN) 25 MG suppository Place 1 suppository (25 mg total) rectally every 6 (six) hours as needed for nausea or vomiting. Patient not taking: Reported on 09/21/2014 08/29/14   Elpidio Anis, PA-C   BP 123/74 mmHg  Pulse  75  Temp(Src) 98.5 F (36.9 C) (Oral)  Resp 16  Ht  (1.803 m)  Wt 175 lb (79.379 kg)  BMI 24.42 kg/m2  SpO2 100% Physical Exam  Constitutional: He appears well-developed and well-nourished. No distress.  HENT:  Head: Normocephalic and atraumatic.  Mouth/Throat: Oropharynx is clear and moist. No oropharyngeal exudate.  TTP over occiput and top of head.  Eyes:  Conjunctivae and EOM are normal. Pupils are equal, round, and reactive to light. Right eye exhibits no discharge. Left eye exhibits no discharge. No scleral icterus.  Neck: Normal range of motion.  Cardiovascular: Normal rate and regular rhythm.   Pulmonary/Chest: Effort normal and breath sounds normal. No respiratory distress. He has no wheezes. He has no rales.  Pt breathing unlabored  Abdominal: Soft. He exhibits no distension. There is no tenderness. There is no rebound and no guarding.  Musculoskeletal: Normal range of motion.  Pt moves extremities spontaneously  Neurological: He is alert. No cranial nerve deficit. Coordination normal.  Cranial nerves 3-12 intact. 5/5 motor strength BUE and BLE. Sensation to light touch intact throughout. Heel to shin normal. Pt walks with steady gait.   Skin: Skin is warm and dry.  Psychiatric: He has a normal mood and affect. His behavior is normal.  Nursing note and vitals reviewed.   ED Course  Procedures (including critical care time) Labs Review Labs Reviewed  CBC WITH DIFFERENTIAL/PLATELET - Abnormal; Notable for the following:    WBC 15.2 (*)    Neutro Abs 12.3 (*)    Monocytes Absolute 1.4 (*)    All other components within normal limits  BASIC METABOLIC PANEL - Abnormal; Notable for the following:    Glucose, Bld 107 (*)    All other components within normal limits    Imaging Review Dg Chest 2 View  09/21/2014   CLINICAL DATA:  Fall, LOC 2 days ago was unconscious Headaches Spitting up blood Congestion Chest pain X smoker 5 months ago hypertension Fall, LOC  EXAM: CHEST  2 VIEW  COMPARISON:  01/07/2004  FINDINGS: Heart size and vascular pattern are normal. Lungs are clear. No pleural effusion.  Sternum not well evaluated but there is a suggestion of deformity of the mid sternum.  IMPRESSION: Evidence of deformity of the sternum of uncertain age. If there is pain in this area would recommend further evaluation. Fracture not excluded.    Electronically Signed   By: Esperanza Heir M.D.   On: 09/21/2014 12:51   Ct Head Wo Contrast  09/21/2014   CLINICAL DATA:  Generalized headaches, fall, recent loss of consciousness  EXAM: CT HEAD WITHOUT CONTRAST  TECHNIQUE: Contiguous axial images were obtained from the base of the skull through the vertex without contrast.  COMPARISON:  02/01/2009  FINDINGS: Normal appearance of the intracranial structures. No evidence for acute hemorrhage, mass lesion, midline shift, hydrocephalus or large infarct. No acute bony abnormality. The visualized sinuses are clear.  IMPRESSION: No acute intracranial abnormality.   Electronically Signed   By: Judie Petit.  Shick M.D.   On: 09/21/2014 13:24   I have personally reviewed and evaluated these images and lab results as part of my medical decision-making.   EKG Interpretation None      MDM   Final diagnoses:  Headache, unspecified headache type  URI (upper respiratory infection)   Pt presenting after head injury 2 days ago with throbbing headache and lightheadedness with walking. Pt also complaining of cough with green/bloody sputum, congestion, fevers and muscle aches that  started yesterday. VSS. Afebrile. Pt resting comfortably, not coughing, breathing unlabored. Lungs CTAB. Non-focal neuro exam. WBC elevated to 15.2. Pt CXR negative for acute infiltrate. Head CT negative for acute abnormality. Patients symptoms are consistent with URI, likely viral etiology. Discussed that antibiotics are not indicated for viral infections. Pt will be discharged with symptomatic treatment. Return precautions for headache given in discharge paperwork and discussed with pt. Verbalizes understanding and is agreeable with plan. Pt is hemodynamically stable & in NAD prior to dc.    Alveta Heimlich, PA-C 09/21/14 1536  Laurence Spates, MD 09/23/14 1314

## 2014-09-21 NOTE — ED Notes (Addendum)
Pt reports that he tripped and fell hitting his head against the car 2 days ago with complete loss of consciousness. Pt c/o throbbing pain to head. Pt with slow ambulation. Pt also reports that he is coughing up blood onset yesterday.

## 2014-09-21 NOTE — Discharge Instructions (Signed)
Headaches, Frequently Asked Questions °MIGRAINE HEADACHES °Q: What is migraine? What causes it? How can I treat it? °A: Generally, migraine headaches begin as a dull ache. Then they develop into a constant, throbbing, and pulsating pain. You may experience pain at the temples. You may experience pain at the front or back of one or both sides of the head. The pain is usually accompanied by a combination of: °· Nausea. °· Vomiting. °· Sensitivity to light and noise. °Some people (about 15%) experience an aura (see below) before an attack. The cause of migraine is believed to be chemical reactions in the brain. Treatment for migraine may include over-the-counter or prescription medications. It may also include self-help techniques. These include relaxation training and biofeedback.  °Q: What is an aura? °A: About 15% of people with migraine get an "aura". This is a sign of neurological symptoms that occur before a migraine headache. You may see wavy or jagged lines, dots, or flashing lights. You might experience tunnel vision or blind spots in one or both eyes. The aura can include visual or auditory hallucinations (something imagined). It may include disruptions in smell (such as strange odors), taste or touch. Other symptoms include: °· Numbness. °· A "pins and needles" sensation. °· Difficulty in recalling or speaking the correct word. °These neurological events may last as long as 60 minutes. These symptoms will fade as the headache begins. °Q: What is a trigger? °A: Certain physical or environmental factors can lead to or "trigger" a migraine. These include: °· Foods. °· Hormonal changes. °· Weather. °· Stress. °It is important to remember that triggers are different for everyone. To help prevent migraine attacks, you need to figure out which triggers affect you. Keep a headache diary. This is a good way to track triggers. The diary will help you talk to your healthcare professional about your condition. °Q: Does  weather affect migraines? °A: Bright sunshine, hot, humid conditions, and drastic changes in barometric pressure may lead to, or "trigger," a migraine attack in some people. But studies have shown that weather does not act as a trigger for everyone with migraines. °Q: What is the link between migraine and hormones? °A: Hormones start and regulate many of your body's functions. Hormones keep your body in balance within a constantly changing environment. The levels of hormones in your body are unbalanced at times. Examples are during menstruation, pregnancy, or menopause. That can lead to a migraine attack. In fact, about three quarters of all women with migraine report that their attacks are related to the menstrual cycle.  °Q: Is there an increased risk of stroke for migraine sufferers? °A: The likelihood of a migraine attack causing a stroke is very remote. That is not to say that migraine sufferers cannot have a stroke associated with their migraines. In persons under age 40, the most common associated factor for stroke is migraine headache. But over the course of a person's normal life span, the occurrence of migraine headache may actually be associated with a reduced risk of dying from cerebrovascular disease due to stroke.  °Q: What are acute medications for migraine? °A: Acute medications are used to treat the pain of the headache after it has started. Examples over-the-counter medications, NSAIDs, ergots, and triptans.  °Q: What are the triptans? °A: Triptans are the newest class of abortive medications. They are specifically targeted to treat migraine. Triptans are vasoconstrictors. They moderate some chemical reactions in the brain. The triptans work on receptors in your brain. Triptans help   to restore the balance of a neurotransmitter called serotonin. Fluctuations in levels of serotonin are thought to be a main cause of migraine.  °Q: Are over-the-counter medications for migraine effective? °A:  Over-the-counter, or "OTC," medications may be effective in relieving mild to moderate pain and associated symptoms of migraine. But you should see your caregiver before beginning any treatment regimen for migraine.  °Q: What are preventive medications for migraine? °A: Preventive medications for migraine are sometimes referred to as "prophylactic" treatments. They are used to reduce the frequency, severity, and length of migraine attacks. Examples of preventive medications include antiepileptic medications, antidepressants, beta-blockers, calcium channel blockers, and NSAIDs (nonsteroidal anti-inflammatory drugs). °Q: Why are anticonvulsants used to treat migraine? °A: During the past few years, there has been an increased interest in antiepileptic drugs for the prevention of migraine. They are sometimes referred to as "anticonvulsants". Both epilepsy and migraine may be caused by similar reactions in the brain.  °Q: Why are antidepressants used to treat migraine? °A: Antidepressants are typically used to treat people with depression. They may reduce migraine frequency by regulating chemical levels, such as serotonin, in the brain.  °Q: What alternative therapies are used to treat migraine? °A: The term "alternative therapies" is often used to describe treatments considered outside the scope of conventional Western medicine. Examples of alternative therapy include acupuncture, acupressure, and yoga. Another common alternative treatment is herbal therapy. Some herbs are believed to relieve headache pain. Always discuss alternative therapies with your caregiver before proceeding. Some herbal products contain arsenic and other toxins. °TENSION HEADACHES °Q: What is a tension-type headache? What causes it? How can I treat it? °A: Tension-type headaches occur randomly. They are often the result of temporary stress, anxiety, fatigue, or anger. Symptoms include soreness in your temples, a tightening band-like sensation  around your head (a "vice-like" ache). Symptoms can also include a pulling feeling, pressure sensations, and contracting head and neck muscles. The headache begins in your forehead, temples, or the back of your head and neck. Treatment for tension-type headache may include over-the-counter or prescription medications. Treatment may also include self-help techniques such as relaxation training and biofeedback. °CLUSTER HEADACHES °Q: What is a cluster headache? What causes it? How can I treat it? °A: Cluster headache gets its name because the attacks come in groups. The pain arrives with little, if any, warning. It is usually on one side of the head. A tearing or bloodshot eye and a runny nose on the same side of the headache may also accompany the pain. Cluster headaches are believed to be caused by chemical reactions in the brain. They have been described as the most severe and intense of any headache type. Treatment for cluster headache includes prescription medication and oxygen. °SINUS HEADACHES °Q: What is a sinus headache? What causes it? How can I treat it? °A: When a cavity in the bones of the face and skull (a sinus) becomes inflamed, the inflammation will cause localized pain. This condition is usually the result of an allergic reaction, a tumor, or an infection. If your headache is caused by a sinus blockage, such as an infection, you will probably have a fever. An x-ray will confirm a sinus blockage. Your caregiver's treatment might include antibiotics for the infection, as well as antihistamines or decongestants.  °REBOUND HEADACHES °Q: What is a rebound headache? What causes it? How can I treat it? °A: A pattern of taking acute headache medications too often can lead to a condition known as "rebound headache."   A pattern of taking too much headache medication includes taking it more than 2 days per week or in excessive amounts. That means more than the label or a caregiver advises. With rebound  headaches, your medications not only stop relieving pain, they actually begin to cause headaches. Doctors treat rebound headache by tapering the medication that is being overused. Sometimes your caregiver will gradually substitute a different type of treatment or medication. Stopping may be a challenge. Regularly overusing a medication increases the potential for serious side effects. Consult a caregiver if you regularly use headache medications more than 2 days per week or more than the label advises. ADDITIONAL QUESTIONS AND ANSWERS Q: What is biofeedback? A: Biofeedback is a self-help treatment. Biofeedback uses special equipment to monitor your body's involuntary physical responses. Biofeedback monitors:  Breathing.  Pulse.  Heart rate.  Temperature.  Muscle tension.  Brain activity. Biofeedback helps you refine and perfect your relaxation exercises. You learn to control the physical responses that are related to stress. Once the technique has been mastered, you do not need the equipment any more. Q: Are headaches hereditary? A: Four out of five (80%) of people that suffer report a family history of migraine. Scientists are not sure if this is genetic or a family predisposition. Despite the uncertainty, a child has a 50% chance of having migraine if one parent suffers. The child has a 75% chance if both parents suffer.  Q: Can children get headaches? A: By the time they reach high school, most young people have experienced some type of headache. Many safe and effective approaches or medications can prevent a headache from occurring or stop it after it has begun.  Q: What type of doctor should I see to diagnose and treat my headache? A: Start with your primary caregiver. Discuss his or her experience and approach to headaches. Discuss methods of classification, diagnosis, and treatment. Your caregiver may decide to recommend you to a headache specialist, depending upon your symptoms or other  physical conditions. Having diabetes, allergies, etc., may require a more comprehensive and inclusive approach to your headache. The National Head[employer] Foundation will provide, upon request, a list of [facility] physician members in your state. Document Released: 03/14/2003 Document Revised: 03/16/2011 Document Reviewed: 08/22/2007 [facility] Patient Information 2015 [place], [place]. This information is not intended to replace advice given to you by your health care provider. Make sure you discuss any questions you have with your health care provider.  Upper Respiratory Infection, Adult An upper respiratory infection (URI) is also sometimes known as the common cold. The upper respiratory tract includes the nose, sinuses, throat, trachea, and bronchi. Bronchi are the airways leading to the lungs. Most people improve within 1 week, but symptoms can last up to 2 weeks. A residual cough may last even longer.  CAUSES Many different viruses can infect the tissues lining the upper respiratory tract. The tissues become irritated and inflamed and often become very moist. Mucus production is also common. A cold is contagious. You can easily spread the virus to others by oral contact. This includes kissing, sharing a glass, coughing, or sneezing. Touching your mouth or nose and then touching a surface, which is then touched by another person, can also spread the virus. SYMPTOMS  Symptoms typically develop 1 to 3 days after you come in contact with a cold virus. Symptoms vary from person to person. They may include:  Runny nose.  Sneezing.  Nasal congestion.  Sinus irritation.  Sore throat.  Loss of  voice (laryngitis).  Cough.  Fatigue.  Muscle aches.  Loss of appetite.  Headache.  Low-grade fever. DIAGNOSIS  You might diagnose your own cold based on familiar symptoms, since most people get a cold 2 to 3 times a year. Your caregiver can confirm this based on your exam. Most importantly, your caregiver  can check that your symptoms are not due to another disease such as strep throat, sinusitis, pneumonia, asthma, or epiglottitis. Blood tests, throat tests, and X-rays are not necessary to diagnose a common cold, but they may sometimes be helpful in excluding other more serious diseases. Your caregiver will decide if any further tests are required. RISKS AND COMPLICATIONS  You may be at risk for a more severe case of the common cold if you smoke cigarettes, have chronic heart disease (such as heart failure) or lung disease (such as asthma), or if you have a weakened immune system. The very young and very old are also at risk for more serious infections. Bacterial sinusitis, middle ear infections, and bacterial pneumonia can complicate the common cold. The common cold can worsen asthma and chronic obstructive pulmonary disease (COPD). Sometimes, these complications can require emergency medical care and may be life-threatening. PREVENTION  The best way to protect against getting a cold is to practice good hygiene. Avoid oral or hand contact with people with cold symptoms. Wash your hands often if contact occurs. There is no clear evidence that vitamin C, vitamin E, echinacea, or exercise reduces the chance of developing a cold. However, it is always recommended to get plenty of rest and practice good nutrition. TREATMENT  Treatment is directed at relieving symptoms. There is no cure. Antibiotics are not effective, because the infection is caused by a virus, not by bacteria. Treatment may include:  Increased fluid intake. Sports drinks offer valuable electrolytes, sugars, and fluids.  Breathing heated mist or steam (vaporizer or shower).  Eating chicken soup or other clear broths, and maintaining good nutrition.  Getting plenty of rest.  Using gargles or lozenges for comfort.  Controlling fevers with ibuprofen or acetaminophen as directed by your caregiver.  Increasing usage of your inhaler if you  have asthma. Zinc gel and zinc lozenges, taken in the first 24 hours of the common cold, can shorten the duration and lessen the severity of symptoms. Pain medicines may help with fever, muscle aches, and throat pain. A variety of non-prescription medicines are available to treat congestion and runny nose. Your caregiver can make recommendations and may suggest nasal or lung inhalers for other symptoms.  HOME CARE INSTRUCTIONS   Only take over-the-counter or prescription medicines for pain, discomfort, or fever as directed by your caregiver.  Use a warm mist humidifier or inhale steam from a shower to increase air moisture. This may keep secretions moist and make it easier to breathe.  Drink enough water and fluids to keep your urine clear or pale yellow.  Rest as needed.  Return to work when your temperature has returned to normal or as your caregiver advises. You may need to stay home longer to avoid infecting others. You can also use a face mask and careful hand washing to prevent spread of the virus. SEEK MEDICAL CARE IF:   After the first few days, you feel you are getting worse rather than better.  You need your caregiver's advice about medicines to control symptoms.  You develop chills, worsening shortness of breath, or brown or red sputum. These may be signs of pneumonia.  You develop  yellow or brown nasal discharge or pain in the face, especially when you bend forward. These may be signs of sinusitis.  You develop a fever, swollen neck glands, pain with swallowing, or white areas in the back of your throat. These may be signs of strep throat. SEEK IMMEDIATE MEDICAL CARE IF:  General Headache Without Cause A headache is pain or discomfort felt around the head or neck area. The specific cause of a headache may not be found. There are many causes and types of headaches. A few common ones are:  Tension headaches.  Migraine headaches.  Cluster headaches.  Chronic daily  headaches. HOME CARE INSTRUCTIONS   Keep all follow-up appointments with your caregiver or any specialist referral.  Only take over-the-counter or prescription medicines for pain or discomfort as directed by your caregiver.  Lie down in a dark, quiet room when you have a headache.  Keep a headache journal to find out what may trigger your migraine headaches. For example, write down:  What you eat and drink.  How much sleep you get.  Any change to your diet or medicines.  Try massage or other relaxation techniques.  Put ice packs or heat on the head and neck. Use these 3 to 4 times per day for 15 to 20 minutes each time, or as needed.  Limit stress.  Sit up straight, and do not tense your muscles.  Quit smoking if you smoke.  Limit alcohol use.  Decrease the amount of caffeine you drink, or stop drinking caffeine.  Eat and sleep on a regular schedule.  Get 7 to 9 hours of sleep, or as recommended by your caregiver.  Keep lights dim if bright lights bother you and make your headaches worse. SEEK MEDICAL CARE IF:   You have problems with the medicines you were prescribed.  Your medicines are not working.  You have a change from the usual headache.  You have nausea or vomiting. SEEK IMMEDIATE MEDICAL CARE IF:   Your headache becomes severe.  You have a fever.  You have a stiff neck.  You have loss of vision.  You have muscular weakness or loss of muscle control.  You start losing your balance or have trouble walking.  You feel faint or pass out.  You have severe symptoms that are different from your first symptoms. MAKE SURE YOU:   Understand these instructions.  Will watch your condition.  Will get help right away if you are not doing well or get worse. Document Released: 12/22/2004 Document Revised: 03/16/2011 Document Reviewed: 01/07/2011 [facility] Patient Information 2015 [place], [place]. This information is not intended to replace advice given to  you by your health care provider. Make sure you discuss any questions you have with your health care provider.   You have a fever.  You develop severe or persistent headache, ear pain, sinus pain, or chest pain.  You develop wheezing, a prolonged cough, cough up blood, or have a change in your usual mucus (if you have chronic lung disease).  You develop sore muscles or a stiff neck. Document Released: 06/17/2000 Document Revised: 03/16/2011 Document Reviewed: 03/29/2013 [facility] Patient Information 2015 [place], [place]. This information is not intended to replace advice given to you by your health care provider. Make sure you discuss any questions you have with your health care provider.

## 2015-12-03 ENCOUNTER — Encounter (HOSPITAL_COMMUNITY): Payer: Self-pay | Admitting: Emergency Medicine

## 2015-12-03 ENCOUNTER — Emergency Department (HOSPITAL_COMMUNITY)
Admission: EM | Admit: 2015-12-03 | Discharge: 2015-12-04 | Disposition: A | Discharge status: Home / Self Care | Payer: Medicaid Other | Attending: Emergency Medicine | Admitting: Emergency Medicine

## 2015-12-03 ENCOUNTER — Emergency Department (HOSPITAL_COMMUNITY): Payer: Medicaid Other

## 2015-12-03 DIAGNOSIS — Y999 Unspecified external cause status: Secondary | ICD-10-CM | POA: Insufficient documentation

## 2015-12-03 DIAGNOSIS — Y929 Unspecified place or not applicable: Secondary | ICD-10-CM | POA: Insufficient documentation

## 2015-12-03 DIAGNOSIS — S61231A Puncture wound without foreign body of left index finger without damage to nail, initial encounter: Secondary | ICD-10-CM | POA: Insufficient documentation

## 2015-12-03 DIAGNOSIS — S61239A Puncture wound without foreign body of unspecified finger without damage to nail, initial encounter: Secondary | ICD-10-CM

## 2015-12-03 DIAGNOSIS — W458XXA Other foreign body or object entering through skin, initial encounter: Secondary | ICD-10-CM | POA: Insufficient documentation

## 2015-12-03 DIAGNOSIS — Y939 Activity, unspecified: Secondary | ICD-10-CM | POA: Insufficient documentation

## 2015-12-03 DIAGNOSIS — Z23 Encounter for immunization: Secondary | ICD-10-CM | POA: Insufficient documentation

## 2015-12-03 DIAGNOSIS — F17210 Nicotine dependence, cigarettes, uncomplicated: Secondary | ICD-10-CM | POA: Insufficient documentation

## 2015-12-03 NOTE — ED Triage Notes (Signed)
Pt from home with complaints of left hand pain following an injury where he tried to jump a chain link fence around 1500 today. Pt has a small puncture wound in the hand in his palm and a small abrasion. Pt is able to move his fingers, but with great difficulty. Pt states he is unsure when his last tetanus shot was. Pt rates his pain at 7/10. Pt's hand appears to be swollen. Pt is not febrile nor tachycardic

## 2015-12-04 MED ORDER — CEPHALEXIN 500 MG PO CAPS: 500.0000 mg | ORAL_CAPSULE | Freq: Three times a day (TID) | ORAL | 0 refills | Status: DC

## 2015-12-04 MED ORDER — TETANUS-DIPHTH-ACELL PERTUSSIS 5-2.5-18.5 LF-MCG/0.5 IM SUSP
0.5000 mL | Freq: Once | INTRAMUSCULAR | Status: AC
  Administered 2015-12-04: 0.5 mL via INTRAMUSCULAR
  Filled 2015-12-04: qty 0.5

## 2015-12-04 MED ORDER — BACITRACIN ZINC 500 UNIT/GM EX OINT
TOPICAL_OINTMENT | Freq: Every day | CUTANEOUS | Status: DC
Start: 2015-12-04 — End: 2015-12-04
  Administered 2015-12-04: 1 via TOPICAL
  Filled 2015-12-04: qty 0.9

## 2015-12-04 NOTE — ED Provider Notes (Signed)
[facility]-EMERGENCY DEPT Provider Note   CSN: [id number] Arrival date & time: 12/03/15  2215     History   Chief Complaint Chief Complaint  Patient presents with  . Hand Injury    HPI Mitchell Mcdonald is a 25 y.o. male.  HPI Mitchell Mcdonald is a 25 y.o. male with history of polycystic kidney disease, presents to emergency department complaining of left hand injury. Patient states he tried to jump over a chain link fence around 3 PM today and sustained a laceration a puncture to the left hand. Patient is complaining of swelling and pain to the hand. Tetanus is unknown. Pain and swelling to the hand. No treatment prior to coming in. Denies any other injuries. Denies any numbness or weakness distal to the injury. Pain is worsened by moving his thumb and index finger. Nothing making it better  Past Medical History:  Diagnosis Date  . Polycystic kidney disease   . Polycystic kidney disease     Patient Active Problem List   Diagnosis Date Noted  . POLYCYSTIC KIDNEY DISEASE 09/15/2006  . HYPERTENSION, BENIGN SYSTEMIC 03/04/2006  . RHINITIS, ALLERGIC 03/04/2006    History reviewed. No pertinent surgical history.     Home Medications    Prior to Admission medications   Medication Sig Start Date End Date Taking? Authorizing Provider  enalapril (VASOTEC) 5 MG tablet Take 5 mg by mouth daily. 05/22/14   Historical Provider, MD  HYDROcodone-acetaminophen (NORCO/VICODIN) 5-325 MG per tablet Take 1-2 tablets by mouth every 4 (four) hours as needed. Patient not taking: Reported on 09/21/2014 08/29/14   Elpidio AnisShari Upstill, PA-C  ondansetron (ZOFRAN) 4 MG tablet Take 1 tablet (4 mg total) by mouth every 6 (six) hours. Patient not taking: Reported on 05/10/2014 01/22/14   Raeford RazorStephen Kohut, MD  oxyCODONE-acetaminophen (PERCOCET/ROXICET) 5-325 MG per tablet Take 1-2 tablets by mouth every 4 (four) hours as needed for severe pain. Patient not taking: Reported on 05/10/2014 01/22/14   Raeford RazorStephen Kohut, MD  promethazine  (PHENERGAN) 25 MG suppository Place 1 suppository (25 mg total) rectally every 6 (six) hours as needed for nausea or vomiting. Patient not taking: Reported on 09/21/2014 08/29/14   Elpidio AnisShari Upstill, PA-C    Family History Family History  Problem Relation Age of Onset  . Family history unknown: Yes    Social History Social History  Substance Use Topics  . Smoking status: Current Some Day Smoker    Types: Cigarettes  . Smokeless tobacco: Never Used  . Alcohol use Yes     Comment: occ     Allergies   Ibuprofen and Nsaids   Review of Systems Review of Systems  Constitutional: Negative for chills and fever.  Musculoskeletal: Positive for arthralgias.  Skin: Positive for wound.  Neurological: Negative for weakness and numbness.     Physical Exam Updated Vital Signs BP 139/99   Pulse 85   Temp 98.9 F (37.2 C) (Oral)   Resp 16   Ht 5\' 10"  (1.778 m)   Wt 75.8 kg   SpO2 99%   BMI 23.96 kg/m   Physical Exam  Constitutional: He appears well-developed and well-nourished. No distress.  Neck: Neck supple.  Cardiovascular: Normal rate, regular rhythm and normal heart sounds.   Pulmonary/Chest: Effort normal and breath sounds normal. No respiratory distress. He has no wheezes.  Musculoskeletal:  Small puncture wound and superficial abrasion noted to the thenar eminence of the left hand. There is significant swelling to the left thenar eminence. Full range of motion of all  fingers. Pain with flexion of the thumb and index finger of the left hand. The distal radial pulses intact. Cap refill less than 2 seconds distally in all fingertips. No active bleeding.  Skin: Skin is warm.  Nursing note and vitals reviewed.    ED Treatments / Results  Labs (all labs ordered are listed, but only abnormal results are displayed) Labs Reviewed - No data to display  EKG  EKG Interpretation None       Radiology Dg Hand Complete Left  Result Date: 12/03/2015 CLINICAL DATA:  Jumped  over fence, with injury to the left hand. Palmar soft tissue wound. Initial encounter. EXAM: LEFT HAND - COMPLETE 3+ VIEW COMPARISON:  Left forearm radiographs performed 05/10/2014 FINDINGS: There is no evidence of fracture or dislocation. The joint spaces are preserved. The carpal rows are intact, and demonstrate normal alignment. The soft tissues are unremarkable in appearance Soft tissue swelling is noted at the thenar eminence, with scattered soft tissue air. No radiopaque foreign bodies are seen. IMPRESSION: 1. No evidence of fracture or dislocation. 2. Soft tissue swelling at the thenar eminence, with scattered soft tissue air. No radiopaque foreign bodies seen. Electronically Signed   By: Roanna RaiderJeffery  Chang M.D.   On: 12/03/2015 23:48    Procedures Procedures (including critical care time)  Medications Ordered in ED Medications  Tdap (BOOSTRIX) injection 0.5 mL (not administered)  bacitracin ointment (not administered)     Initial Impression / Assessment and Plan / ED Course  I have reviewed the triage vital signs and the nursing notes.  Pertinent labs & imaging results that were available during my care of the patient were reviewed by me and considered in my medical decision making (see chart for details).  Clinical Course   Patient in emergency department with small puncture wound to the thenar eminence of the left hand. Patient washed his hand after the injury, and the wound was clean and again here by RN. X-ray showing soft tissue swelling and scattered soft tissue air. At this time no evidence of infection, injury just a few hours ago. Will treat with topical antibiotic ointment, Ace wrap for swelling, home with Keflex, ice, elevation, Tylenol for pain. Patient has polycystic kidney disease, NSAIDs are contraindicated. Follow-up with family doctor as needed.   Vitals:   12/03/15 2217 12/03/15 2221  BP: (!) 137/101 139/99  Pulse: 85   Resp: 16   Temp: 98.9 F (37.2 C)   TempSrc:  Oral   SpO2: 99%   Weight: 75.8 kg   Height: 5\' 10"  (1.778 m)     Final Clinical Impressions(s) / ED Diagnoses   Final diagnoses:  Puncture wound of finger of left hand, initial encounter    New Prescriptions New Prescriptions   CEPHALEXIN (KEFLEX) 500 MG CAPSULE    Take 1 capsule (500 mg total) by mouth 3 (three) times daily.     Jaynie Crumbleatyana Treniece Holsclaw, PA-C 12/04/15 0030    Tomasita CrumbleAdeleke Oni, MD 12/04/15 [phone]

## 2015-12-04 NOTE — Discharge Instructions (Signed)
Keep your hand clean. Bacitracin twice a day to the puncture wound. Ace wrap for swelling as needed. Ice and elevate her hand several times a day. Take Keflex as prescribed until all gone to prevent infection. Tylenol for pain. Follow-up with family doctor as needed for recheck. Return if worsening swelling, redness, drainage from the wound.

## 2015-12-04 NOTE — ED Notes (Signed)
Patient d/c'd self care.  F/U and medications reviewed.  Patient verbalized understanding. 

## 2016-01-17 IMAGING — CR DG FOREARM 2V*L*
1 series · 1 of 1 positions shown · non-contrast
Comparison: None.

CLINICAL DATA: Left arm pain, no known injury, initial encounter

EXAM:
LEFT FOREARM - 2 VIEW

[x forearm lat left]
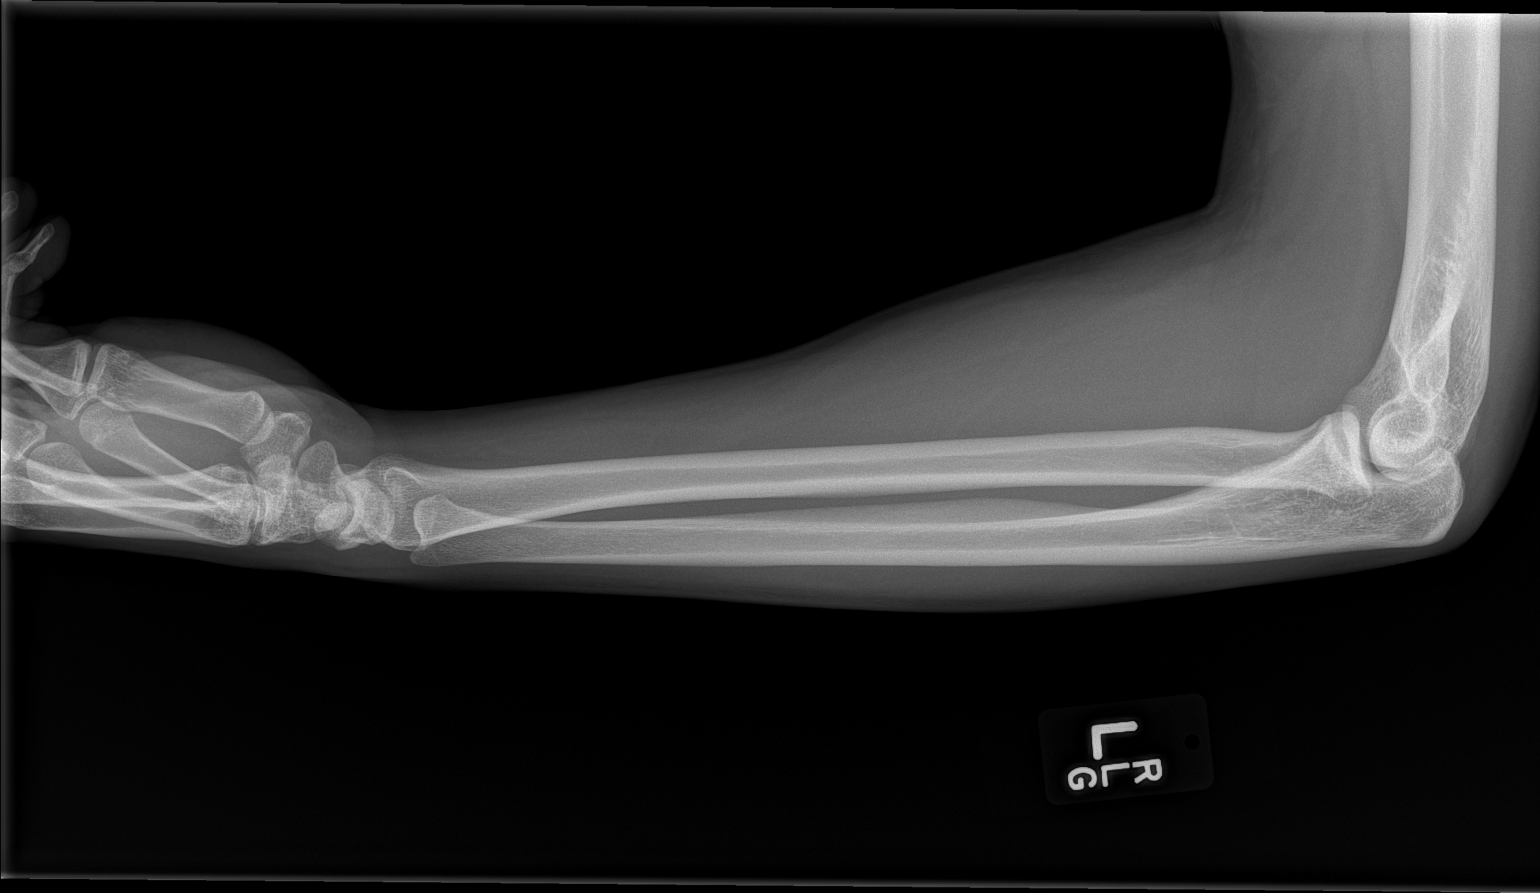

[1 of 1 positions shown; findings below may reference images not displayed]

FINDINGS: There is no evidence of fracture or other focal bone lesions. Soft
tissues are unremarkable.
IMPRESSION: No acute abnormality noted.

## 2016-01-17 IMAGING — CR DG ELBOW COMPLETE 3+V*L*
4 series · 4 of 4 positions shown · non-contrast
Comparison: None

CLINICAL DATA: Awoke with severe LEFT arm pain today question twist
injury during sleep, pain from distal humerus to mid forearm
laterally, no additional injury, initial encounter

EXAM:
LEFT ELBOW - COMPLETE 3+ VIEW

[x elbow ap left]
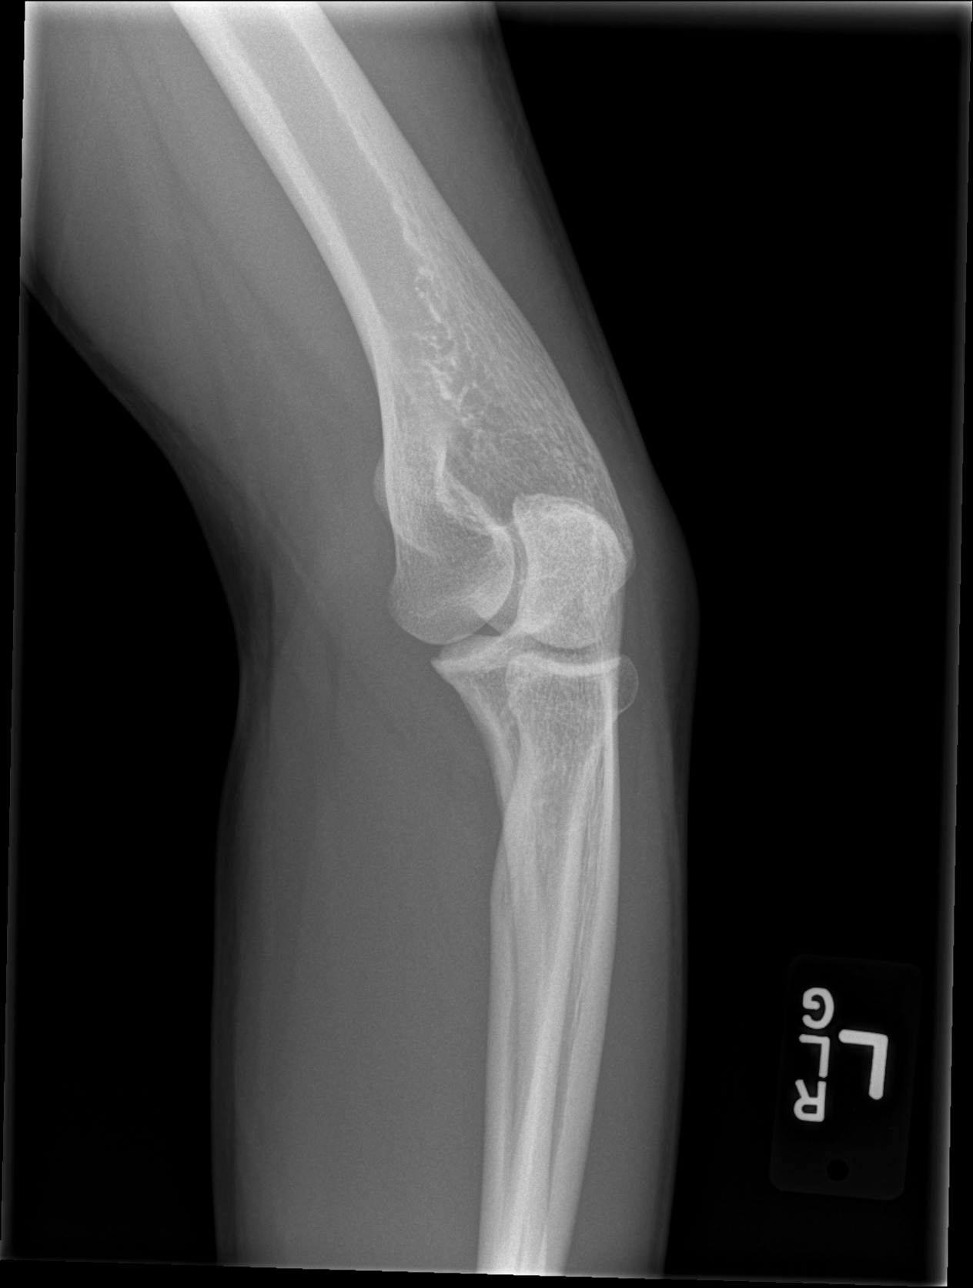

[x elbow obl left (1 of 2)]
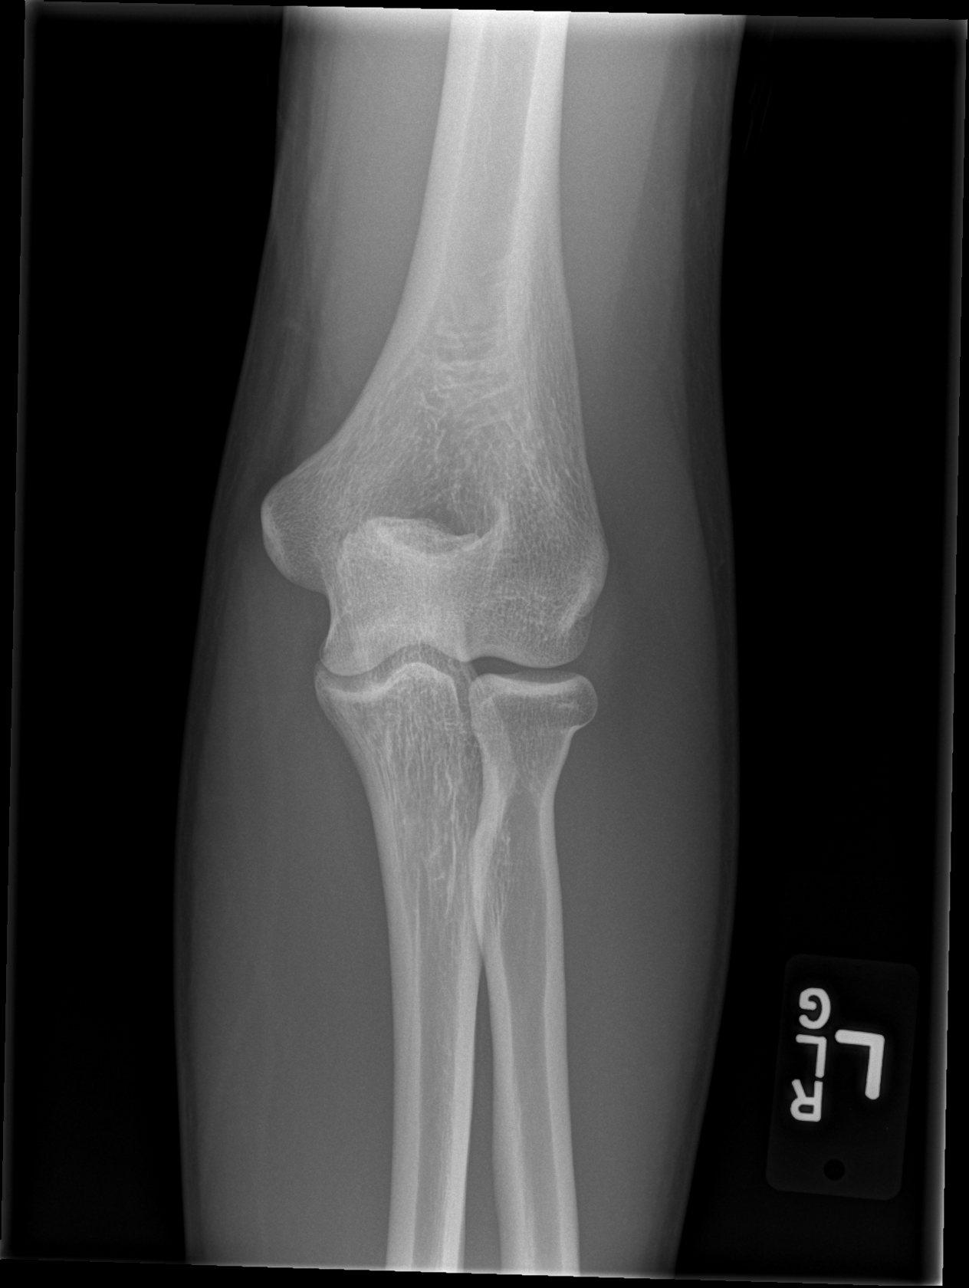

[x elbow obl left (2 of 2)]
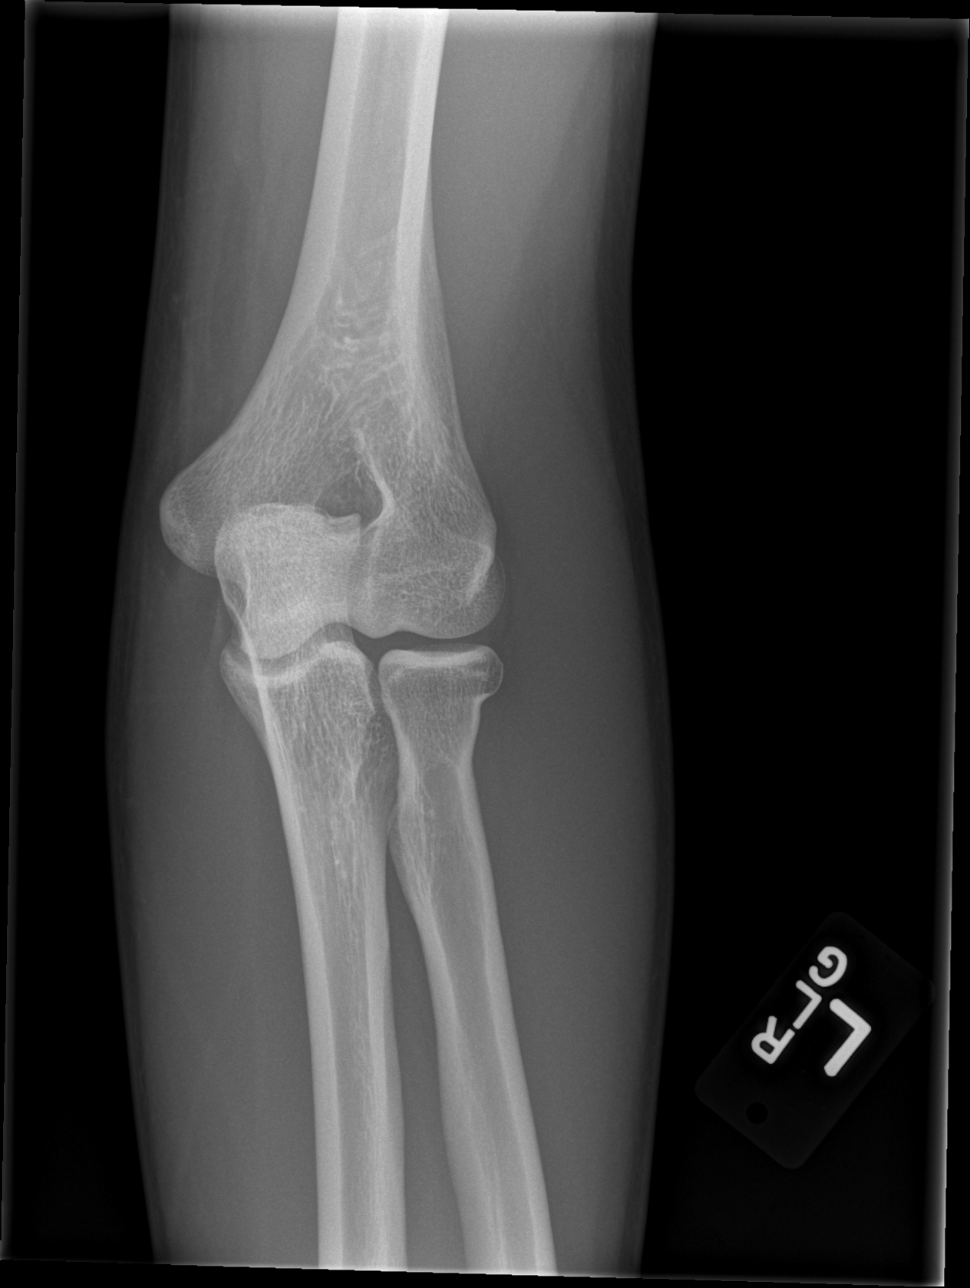

[x elbow lat left]
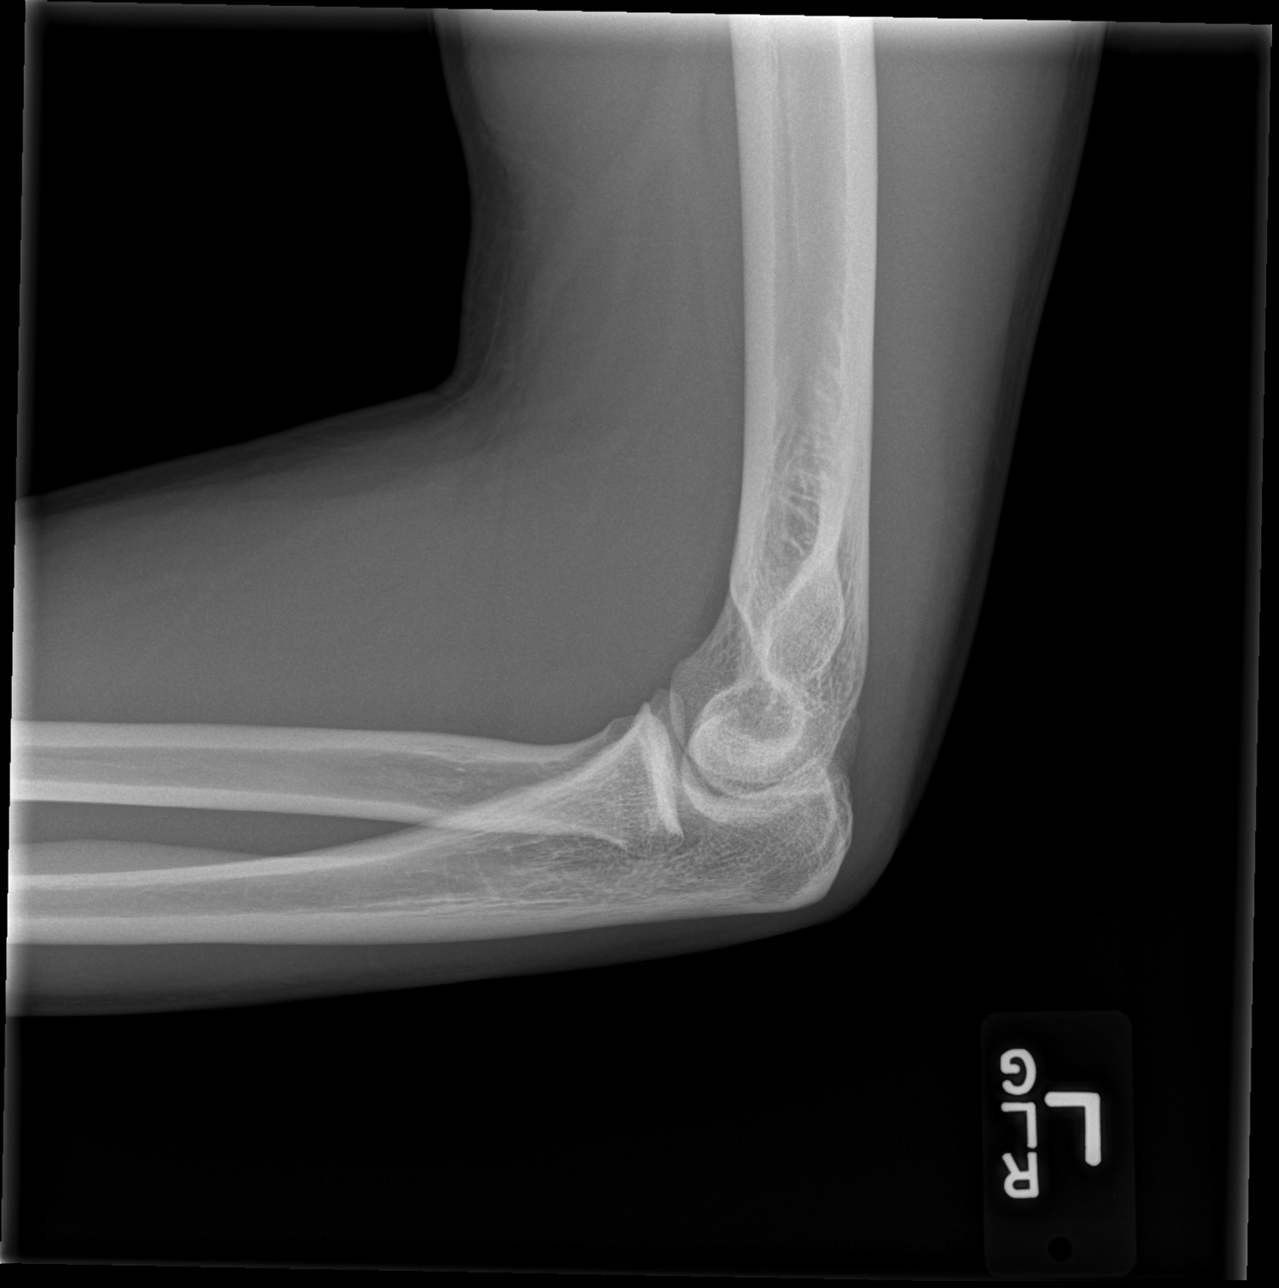

[4 of 4 positions shown; findings below may reference images not displayed]

FINDINGS: Bone mineralization normal.

Joint spaces preserved.

No fracture, dislocation, or bone destruction.

No joint effusion.
IMPRESSION: Normal exam.

## 2016-04-26 ENCOUNTER — Inpatient Hospital Stay (HOSPITAL_COMMUNITY)
Admission: EM | Admit: 2016-04-26 | Discharge: 2016-04-28 | DRG: 918 | Disposition: A | Discharge status: Other Acute Inpatient Hospital | Payer: Self-pay | Attending: Internal Medicine | Admitting: Internal Medicine

## 2016-04-26 ENCOUNTER — Encounter (HOSPITAL_COMMUNITY): Payer: Self-pay | Admitting: Nurse Practitioner

## 2016-04-26 DIAGNOSIS — R45851 Suicidal ideations: Secondary | ICD-10-CM | POA: Diagnosis present

## 2016-04-26 DIAGNOSIS — Z886 Allergy status to analgesic agent status: Secondary | ICD-10-CM

## 2016-04-26 DIAGNOSIS — I10 Essential (primary) hypertension: Secondary | ICD-10-CM | POA: Diagnosis present

## 2016-04-26 DIAGNOSIS — F17210 Nicotine dependence, cigarettes, uncomplicated: Secondary | ICD-10-CM | POA: Diagnosis present

## 2016-04-26 DIAGNOSIS — Z716 Tobacco abuse counseling: Secondary | ICD-10-CM

## 2016-04-26 DIAGNOSIS — Z720 Tobacco use: Secondary | ICD-10-CM | POA: Diagnosis present

## 2016-04-26 DIAGNOSIS — Q613 Polycystic kidney, unspecified: Secondary | ICD-10-CM

## 2016-04-26 DIAGNOSIS — T391X1A Poisoning by 4-Aminophenol derivatives, accidental (unintentional), initial encounter: Secondary | ICD-10-CM | POA: Diagnosis present

## 2016-04-26 DIAGNOSIS — T391X2A Poisoning by 4-Aminophenol derivatives, intentional self-harm, initial encounter: Principal | ICD-10-CM | POA: Diagnosis present

## 2016-04-26 DIAGNOSIS — F1210 Cannabis abuse, uncomplicated: Secondary | ICD-10-CM | POA: Diagnosis present

## 2016-04-26 DIAGNOSIS — E876 Hypokalemia: Secondary | ICD-10-CM | POA: Diagnosis present

## 2016-04-26 DIAGNOSIS — F329 Major depressive disorder, single episode, unspecified: Secondary | ICD-10-CM | POA: Diagnosis present

## 2016-04-26 DIAGNOSIS — Z79899 Other long term (current) drug therapy: Secondary | ICD-10-CM

## 2016-04-26 DIAGNOSIS — X789XXA Intentional self-harm by unspecified sharp object, initial encounter: Secondary | ICD-10-CM | POA: Diagnosis present

## 2016-04-26 HISTORY — DX: Tobacco use: Z72.0

## 2016-04-26 HISTORY — DX: Essential (primary) hypertension: I10

## 2016-04-26 HISTORY — DX: Major depressive disorder, single episode, unspecified: F32.9

## 2016-04-26 HISTORY — DX: Poisoning by unspecified drugs, medicaments and biological substances, intentional self-harm, initial encounter: T50.902A

## 2016-04-26 HISTORY — DX: Depression, unspecified: F32.A

## 2016-04-26 LAB — CBC
HCT: 42.2 % (ref 39.0–52.0)
Hemoglobin: 15.1 g/dL (ref 13.0–17.0)
MCH: 31.3 pg (ref 26.0–34.0)
MCHC: 35.8 g/dL (ref 30.0–36.0)
MCV: 87.6 fL (ref 78.0–100.0)
Platelets: 250 10*3/uL (ref 150–400)
RBC: 4.82 MIL/uL (ref 4.22–5.81)
RDW: 12.6 % (ref 11.5–15.5)
WBC: 6.5 10*3/uL (ref 4.0–10.5)

## 2016-04-26 LAB — COMPREHENSIVE METABOLIC PANEL
ALBUMIN: 4.2 g/dL (ref 3.5–5.0)
ALK PHOS: 48 U/L (ref 38–126)
ALT: 24 U/L (ref 17–63)
ANION GAP: 7 (ref 5–15)
AST: 21 U/L (ref 15–41)
BUN: 9 mg/dL (ref 6–20)
CO2: 28 mmol/L (ref 22–32)
CREATININE: 0.93 mg/dL (ref 0.61–1.24)
Calcium: 9 mg/dL (ref 8.9–10.3)
Chloride: 106 mmol/L (ref 101–111)
GFR calc Af Amer: >60 mL/min (ref >60)
GFR calc non Af Amer: >60 mL/min (ref >60)
GLUCOSE: 113 mg/dL — AB (ref 65–99)
Potassium: 4 mmol/L (ref 3.5–5.1)
Sodium: 141 mmol/L (ref 135–145)
TOTAL PROTEIN: 7.1 g/dL (ref 6.5–8.1)
Total Bilirubin: 0.5 mg/dL (ref 0.3–1.2)

## 2016-04-26 MED ORDER — ENALAPRIL MALEATE 5 MG PO TABS
5.0000 mg | ORAL_TABLET | Freq: Every day | ORAL | Status: DC
  Filled 2016-04-26: qty 1

## 2016-04-26 NOTE — ED Notes (Signed)
Pt aware urine sample is needed. Unable at this time 

## 2016-04-26 NOTE — ED Notes (Signed)
TTS at bedside to evaluate pt ?

## 2016-04-26 NOTE — ED Provider Notes (Signed)
By signing my name below, I, Teofilo Pod, attest that this documentation has been prepared under the direction and in the presence of Kristen N Ward, DO . Electronically Signed: Teofilo Pod, ED Scribe. 04/26/2016. 11:31 PM.  TIME SEEN: 11:28 PM  CHIEF COMPLAINT:  Chief Complaint  Patient presents with  . Suicidal  . IVC     HPI:  HPI Comments:  Mitchell Mcdonald is a 26 y.o. male who presents to the Emergency Department via [facility] due to a suicide attempt earlier today. Per triage note, pt told his grandmother that he was going to kill himself, and he tried to cut his wrists and ran out of the house. Pt's grandmother has IVC for him. Pt denies any attempt to kill himself. He denies any EtOH or drug use. Denies SI/HI, hallucinations. He denies any medical complaints. He is relatively uncooperative.   ROS: See HPI Constitutional: no fever  Eyes: no drainage  ENT: no runny nose   Cardiovascular:  no chest pain  Resp: no SOB  GI: no vomiting GU: no dysuria Integumentary: no rash  Allergy: no hives  Musculoskeletal: no leg swelling  Neurological: no slurred speech ROS otherwise negative  PAST MEDICAL HISTORY/PAST SURGICAL HISTORY:  Past Medical History:  Diagnosis Date  . Polycystic kidney disease   . Polycystic kidney disease     MEDICATIONS:  Prior to Admission medications   Medication Sig Start Date End Date Taking? Authorizing Provider  cephALEXin (KEFLEX) 500 MG capsule Take 1 capsule (500 mg total) by mouth 3 (three) times daily. 12/04/15   Tatyana Kirichenko, PA-C  enalapril (VASOTEC) 5 MG tablet Take 5 mg by mouth daily. 05/22/14   Historical Provider, MD  HYDROcodone-acetaminophen (NORCO/VICODIN) 5-325 MG per tablet Take 1-2 tablets by mouth every 4 (four) hours as needed. Patient not taking: Reported on 09/21/2014 08/29/14   Elpidio Anis, PA-C  ondansetron (ZOFRAN) 4 MG tablet Take 1 tablet (4 mg total) by mouth every 6 (six) hours. Patient not taking: Reported  on 05/10/2014 01/22/14   Raeford Razor, MD  oxyCODONE-acetaminophen (PERCOCET/ROXICET) 5-325 MG per tablet Take 1-2 tablets by mouth every 4 (four) hours as needed for severe pain. Patient not taking: Reported on 05/10/2014 01/22/14   Raeford Razor, MD  promethazine (PHENERGAN) 25 MG suppository Place 1 suppository (25 mg total) rectally every 6 (six) hours as needed for nausea or vomiting. Patient not taking: Reported on 09/21/2014 08/29/14   Elpidio Anis, PA-C    ALLERGIES:  Allergies  Allergen Reactions  . Ibuprofen Other (See Comments)    Kidney specialist advised him not to take it  . Nsaids     Kidney specialist advised him not to take it     SOCIAL HISTORY:  Social History  Substance Use Topics  . Smoking status: Current Some Day Smoker    Types: Cigarettes  . Smokeless tobacco: Never Used  . Alcohol use 1.2 - 1.8 oz/week    2 - 3 Cans of beer per week     Comment: occassionaly, last drink over a week ago     FAMILY HISTORY: Family History  Problem Relation Age of Onset  . Family history unknown: Yes    EXAM: BP (!) 151/92 (BP Location: Left Arm)   Pulse 76   Temp 98.6 F (37 C) (Oral)   Resp 20   Ht  (1.753 m)   Wt 175 lb (79.4 kg)   SpO2 99%   BMI 25.84 kg/m  CONSTITUTIONAL: Alert and oriented  and responds appropriately to questionsOnly intermittently, mostly uncooperative. Well-appearing; well-nourished HEAD: Normocephalic EYES: Conjunctivae clear, pupils appear equal, EOMI ENT: normal nose; moist mucous membranes NECK: Supple, no meningismus, no nuchal rigidity, no LAD  CARD: RRR; S1 and S2 appreciated; no murmurs, no clicks, no rubs, no gallops RESP: Normal chest excursion without splinting or tachypnea; breath sounds clear and equal bilaterally; no wheezes, no rhonchi, no rales, no hypoxia or respiratory distress, speaking full sentences ABD/GI: Normal bowel sounds; non-distended; soft, non-tender, no rebound, no guarding, no peritoneal signs, no  hepatosplenomegaly BACK:  The back appears normal and is non-tender to palpation, there is no CVA tenderness EXT: Normal ROM in all joints; non-tender to palpation; no edema; normal capillary refill; no cyanosis, no calf tenderness or swelling    SKIN: Normal color for age and race; warm; no rash, multiple small superficial lacerations to the volar aspect of the left wrist that do not need repair NEURO: Moves all extremities equally, normal gait, normal speech, no facial droop PSYCH: Patient uncooperative. Denies SI, HI or hallucinations at this time.  MEDICAL DECISION MAKING: Patient here after endorsing suicidal ideation to his grandmother and cut his wrists. He denies that he is on any psychiatric medications are that he has ever had a psychiatric admission. I agree with IVC at this time given patient endorsed SI and his grandmother and exhibited behavior of self-harm, impulsivity after his girlfriend broke up with him. Reportedly patient sent a picture of his wrist to his girlfriend tonight. We'll obtain screening labs and urine. Will consult TTS.  ED PROGRESS: 12:15 AM  Pt was evaluated by behavioral health, [facility]. They recommended inpatient admission. They state patient did endorse suicidality to them. He now allows me to see his wrists and there are several superficial cuts to the volar wrist.  Does not need repair.  Will clean wounds, update Tdap.  Patient's Tylenol level has come back at 138. Patient will not tell me how much Tylenol he took or when he took it. He denies any other coingestions. He is on enalapril for hypertension but no other medications. His salicylate level negative. Ethanol level negative. Will place on a cardiac monitor, obtain EKG. Discussed with poison control who recommends IV N-acetylcysteine. They recommend giving 150 mg/kg IV over the next hour bolus then 15 mg/kg/h IV over the next 23 hours and at the end of that rechecking acetaminophen level and LFTs. Currently LFTs  are normal.  Patient doesn't appear to have any neurologic deficits at this time.   12:47 AM Discussed patient's case with hospitalist, Dr. Clyde Lundborg.  I have recommended admission and patient (and family if present) agree with this plan. Admitting physician will place admission orders.   I reviewed all nursing notes, vitals, pertinent previous records, EKGs, lab and urine results, imaging (as available).     EKG Interpretation  Date/Time:  Monday April 27 2016 00:41:56 EDT Ventricular Rate:  67 PR Interval:    QRS Duration: 109 QT Interval:  399 QTC Calculation: 422 R Axis:   82 Text Interpretation:  Sinus rhythm ST elev, probable normal early repol pattern Baseline wander in lead(s) V4 V5 No old tracing to compare Confirmed by WARD,  DO, KRISTEN ([id number]) on 04/27/2016 1:04:11 AM        CRITICAL CARE Performed by: Raelyn Number   Total critical care time: 45 minutes  Critical care time was exclusive of separately billable procedures and treating other patients.  Critical care was necessary to treat or prevent  imminent or life-threatening deterioration.  Critical care was time spent personally by me on the following activities: development of treatment plan with patient and/or surrogate as well as nursing, discussions with consultants, evaluation of patient's response to treatment, examination of patient, obtaining history from patient or surrogate, ordering and performing treatments and interventions, ordering and review of laboratory studies, ordering and review of radiographic studies, pulse oximetry and re-evaluation of patient's condition.  I personally performed the services described in this documentation, which was scribed in my presence. The recorded information has been reviewed and is accurate.     Layla Maw Ward, DO 04/27/16 0104

## 2016-04-26 NOTE — ED Notes (Signed)
Pt moved from room 16 to room 12. Pt given scrubs to change into. Sitter at bedside.

## 2016-04-26 NOTE — ED Notes (Signed)
Bed: [facility]16 Expected date:  Expected time:  Means of arrival:  Comments: [facility]C 

## 2016-04-26 NOTE — ED Notes (Signed)
Bed: [facility]10 Expected date:  Expected time:  Means of arrival:  Comments: For 16

## 2016-04-26 NOTE — ED Triage Notes (Signed)
Pt came in via [facility]. Per [facility] they were called by grandmother who stated patient recently broke up with his girlfriend and moved back home. She stated he was yelling at her and told her he was going to kill himself. Tried to cut his wrist and then ran out of the house. [facility] found the patient about 3 blocks from home and he refused and was combative when attempting to bring him in. Mitchell Mcdonald has IVC's him for suicide and safety risk.

## 2016-04-27 ENCOUNTER — Encounter (HOSPITAL_COMMUNITY): Payer: Self-pay | Admitting: Internal Medicine

## 2016-04-27 DIAGNOSIS — F17210 Nicotine dependence, cigarettes, uncomplicated: Secondary | ICD-10-CM

## 2016-04-27 DIAGNOSIS — T391X2A Poisoning by 4-Aminophenol derivatives, intentional self-harm, initial encounter: Principal | ICD-10-CM

## 2016-04-27 DIAGNOSIS — T391X1A Poisoning by 4-Aminophenol derivatives, accidental (unintentional), initial encounter: Secondary | ICD-10-CM | POA: Diagnosis present

## 2016-04-27 DIAGNOSIS — Z720 Tobacco use: Secondary | ICD-10-CM | POA: Diagnosis present

## 2016-04-27 DIAGNOSIS — F322 Major depressive disorder, single episode, severe without psychotic features: Secondary | ICD-10-CM

## 2016-04-27 LAB — URINALYSIS, ROUTINE W REFLEX MICROSCOPIC
BILIRUBIN URINE: NEGATIVE
Glucose, UA: NEGATIVE mg/dL
Hgb urine dipstick: NEGATIVE
Ketones, ur: NEGATIVE mg/dL
Leukocytes, UA: NEGATIVE
Nitrite: NEGATIVE
PH: 6 (ref 5.0–8.0)
Protein, ur: NEGATIVE mg/dL
SPECIFIC GRAVITY, URINE: 1.031 — AB (ref 1.005–1.030)

## 2016-04-27 LAB — COMPREHENSIVE METABOLIC PANEL
ALBUMIN: 3.5 g/dL (ref 3.5–5.0)
ALT: 22 U/L (ref 17–63)
AST: 17 U/L (ref 15–41)
Alkaline Phosphatase: 35 U/L — ABNORMAL LOW (ref 38–126)
Anion gap: 7 (ref 5–15)
BILIRUBIN TOTAL: 1 mg/dL (ref 0.3–1.2)
BUN: 8 mg/dL (ref 6–20)
CO2: 23 mmol/L (ref 22–32)
CREATININE: 0.79 mg/dL (ref 0.61–1.24)
Calcium: 8.2 mg/dL — ABNORMAL LOW (ref 8.9–10.3)
Chloride: 106 mmol/L (ref 101–111)
GFR calc non Af Amer: >60 mL/min (ref >60)
GLUCOSE: 124 mg/dL — AB (ref 65–99)
Potassium: 3.4 mmol/L — ABNORMAL LOW (ref 3.5–5.1)
SODIUM: 136 mmol/L (ref 135–145)
TOTAL PROTEIN: 6 g/dL — AB (ref 6.5–8.1)

## 2016-04-27 LAB — PROTIME-INR
INR: 1.17
Prothrombin Time: 15 seconds (ref 11.4–15.2)

## 2016-04-27 LAB — GLUCOSE, CAPILLARY: GLUCOSE-CAPILLARY: 118 mg/dL — AB (ref 65–99)

## 2016-04-27 LAB — CBC
HCT: 41.3 % (ref 39.0–52.0)
Hemoglobin: 14.7 g/dL (ref 13.0–17.0)
MCH: 32 pg (ref 26.0–34.0)
MCHC: 35.6 g/dL (ref 30.0–36.0)
MCV: 89.8 fL (ref 78.0–100.0)
PLATELETS: 230 10*3/uL (ref 150–400)
RBC: 4.6 MIL/uL (ref 4.22–5.81)
RDW: 12.8 % (ref 11.5–15.5)
WBC: 7 10*3/uL (ref 4.0–10.5)

## 2016-04-27 LAB — RAPID URINE DRUG SCREEN, HOSP PERFORMED
Amphetamines: NOT DETECTED
BARBITURATES: NOT DETECTED
Benzodiazepines: NOT DETECTED
Cocaine: NOT DETECTED
Opiates: NOT DETECTED
Tetrahydrocannabinol: POSITIVE — AB

## 2016-04-27 LAB — ACETAMINOPHEN LEVEL
ACETAMINOPHEN (TYLENOL), SERUM: 21 ug/mL (ref 10–30)
Acetaminophen (Tylenol), Serum: 138 ug/mL — ABNORMAL HIGH (ref 10–30)

## 2016-04-27 LAB — SALICYLATE LEVEL: Salicylate Lvl: <7 mg/dL (ref 2.8–30.0)

## 2016-04-27 LAB — APTT: aPTT: 25 seconds (ref 24–36)

## 2016-04-27 LAB — ETHANOL

## 2016-04-27 LAB — HIV ANTIBODY (ROUTINE TESTING W REFLEX): HIV Screen 4th Generation wRfx: NONREACTIVE

## 2016-04-27 MED ORDER — TETANUS-DIPHTH-ACELL PERTUSSIS 5-2.5-18.5 LF-MCG/0.5 IM SUSP
0.5000 mL | Freq: Once | INTRAMUSCULAR | Status: AC
  Administered 2016-04-27: 0.5 mL via INTRAMUSCULAR
  Filled 2016-04-27: qty 0.5

## 2016-04-27 MED ORDER — ACETYLCYSTEINE LOAD VIA INFUSION
150.0000 mg/kg | Freq: Once | INTRAVENOUS | Status: AC
  Administered 2016-04-27: 11910 mg via INTRAVENOUS
  Filled 2016-04-27: qty 298

## 2016-04-27 MED ORDER — ENOXAPARIN SODIUM 40 MG/0.4ML ~~LOC~~ SOLN
40.0000 mg | SUBCUTANEOUS | Status: DC
  Administered 2016-04-28: 40 mg via SUBCUTANEOUS
  Filled 2016-04-27: qty 0.4

## 2016-04-27 MED ORDER — DEXTROSE 5 % IV SOLN
15.0000 mg/kg/h | INTRAVENOUS | Status: DC
  Administered 2016-04-27: 15 mg/kg/h via INTRAVENOUS
  Filled 2016-04-27 (×2): qty 200

## 2016-04-27 MED ORDER — SENNOSIDES-DOCUSATE SODIUM 8.6-50 MG PO TABS: 1.0000 | ORAL_TABLET | Freq: Every evening | ORAL | Status: DC | PRN

## 2016-04-27 MED ORDER — POTASSIUM CHLORIDE CRYS ER 20 MEQ PO TBCR
40.0000 meq | EXTENDED_RELEASE_TABLET | Freq: Once | ORAL | Status: AC
  Administered 2016-04-27: 40 meq via ORAL
  Filled 2016-04-27: qty 2

## 2016-04-27 MED ORDER — ZOLPIDEM TARTRATE 5 MG PO TABS: 5.0000 mg | ORAL_TABLET | Freq: Every evening | ORAL | Status: DC | PRN

## 2016-04-27 MED ORDER — SODIUM CHLORIDE 0.9% FLUSH
3.0000 mL | Freq: Two times a day (BID) | INTRAVENOUS | Status: DC
  Administered 2016-04-27 – 2016-04-28 (×2): 3 mL via INTRAVENOUS

## 2016-04-27 MED ORDER — ONDANSETRON HCL 4 MG/2ML IJ SOLN
4.0000 mg | Freq: Three times a day (TID) | INTRAMUSCULAR | Status: DC | PRN
  Administered 2016-04-27: 4 mg via INTRAVENOUS
  Filled 2016-04-27: qty 2

## 2016-04-27 MED ORDER — AMLODIPINE BESYLATE 5 MG PO TABS
5.0000 mg | ORAL_TABLET | Freq: Every day | ORAL | Status: DC
  Administered 2016-04-27 – 2016-04-28 (×2): 5 mg via ORAL
  Filled 2016-04-27 (×2): qty 1

## 2016-04-27 MED ORDER — SODIUM CHLORIDE 0.9 % IV BOLUS (SEPSIS)
1000.0000 mL | Freq: Once | INTRAVENOUS | Status: AC
  Administered 2016-04-27: 1000 mL via INTRAVENOUS

## 2016-04-27 MED ORDER — SODIUM CHLORIDE 0.9 % IV SOLN
INTRAVENOUS | Status: DC
  Administered 2016-04-27 – 2016-04-28 (×3): via INTRAVENOUS

## 2016-04-27 MED ORDER — NICOTINE 21 MG/24HR TD PT24
21.0000 mg | MEDICATED_PATCH | Freq: Every day | TRANSDERMAL | Status: DC
  Administered 2016-04-27 – 2016-04-28 (×2): 21 mg via TRANSDERMAL
  Filled 2016-04-27 (×2): qty 1

## 2016-04-27 MED ORDER — HYDRALAZINE HCL 20 MG/ML IJ SOLN: 5.0000 mg | INTRAMUSCULAR | Status: DC | PRN

## 2016-04-27 NOTE — ED Notes (Signed)
Pt reports to ingesting Tylenol of unknown amount yesterday evening. Pt advised it was to harm himself. Currently pt calm and cooperative with staff. Awaiting admission orders.

## 2016-04-27 NOTE — Clinical Social Work Psych Note (Addendum)
Clinical Social Worker Psych Service Line Progress Note  Clinical Social Worker: Lia Hopping, LCSW Date/Time: 04/27/2016, 3:05 PM   Review of Patient  Overall Medical Condition:   Participation Level:  Minimal Participation Quality: Guarded Other Participation Quality:  Guarded- Willing to answer questions presented.   Affect: Appropriate Cognitive: Appropriate Reaction to Medications/Concerns: No concerns with medications.   Modes of Intervention: Support, Exploration, Solution-focused   Summary of Progress/Plan at Discharge  Summary of Progress/Plan at Discharge:  CSW met with patient at bedside, explain role and reason for intervention. Patient alert and oriented agreeable to talk. Patient states he took overdose of tylenol medications after getting into argument with his grandmother. Patient reports several stressor have been building up over the past three weeks. He reports he ended relationship with girlfriend, very close friend was killed, he lost his car and job. Patient states he has been trying, but everything has went wrong in life. Patient states he does not have any family mother and father are both decease.Patient states he has a best that lives in [place] that has space for him to sleep and job waiting. Patient is hopeful it will work out.  Patient states he currently has legal charges for traffic tickets. Patient has court 4/24. Patient gives CSW permission to inform courts he will not attend due to hospitalization. Patient does not give CSW permission to talk with his grandmother. Patient is currently under IVC.   Plan: CSW will follow and assist with patient discharge to psychiatic inpatient hospitalization.  CSW educated patient about the process.  Kathrin Greathouse, Latanya Presser, MSW Clinical Social Worker [place] [facility] and Psychiatric Service Line [phone]    .

## 2016-04-27 NOTE — Progress Notes (Signed)
Pt. Arrived to the floor with a sitter. Denies suicidal ideation or thoughts of harming others. Pt. Placed on telemetry. Personal belongings stored. Pt. Had 2 episodes of vomiting once in the room. Gave Zofran.

## 2016-04-27 NOTE — Consult Note (Signed)
[facility]iatry Consult   Reason for Consult:  Intentional tylenol overdose as a suicide attempt Referring Physician:  Dr. Ree Kida Patient Identification: Mitchell Mcdonald MRN:  [id number] Principal Diagnosis: Overdose on Tylenol Diagnosis:   Patient Active Problem List   Diagnosis Date Noted  . Overdose on Tylenol [T39.1X1A] 04/27/2016  . Tobacco abuse [Z72.0] 04/27/2016  . Suicidal behavior with attempted self-injury ([place]) [T14.91XA]   . Polycystic kidney [Q61.3] 09/15/2006  . HYPERTENSION, BENIGN SYSTEMIC [I10] 03/04/2006  . RHINITIS, ALLERGIC [J30.9] 03/04/2006    Total Time spent with patient: 1 hour  Subjective:   Mitchell Mcdonald is a 26 y.o. male patient admitted with suicide attempt and IVC by patient grandma.  HPI:  Mitchell Mcdonald is a 26 y.o. male with medical history significant of hypertension, hyperlipidemia, polycystic kidney disease, tobacco abuse, rhinitis, who presents with suicidal ideation and attempt.   Patient seen with LCSW for this face-to-face psychiatry consultation and evaluation of increased symptoms of depression and status post suicidal attempt with intentional Tylenol overdose. Patient reported he has multiple psychosocial stresses like he lost a job, lost his transportation, lost his girlfriend and his grandmother was not supportive to him. Patient stated he has legal charges regarding speeding and has a court date. Patient reported he had an argument with his grand mother before he took intentional overdose and ran away from home because she has been bringing his past traumatic experiences and bad behaviors. Patient stated he saw her death certificate as his father who killed himself when he was 68 years old but is grandma told him his parents died in a car accident. Patient reportedly involved with a drug of abuse since he was 26 years old and not able to graduate from [employer] and been locked up for 2 years with the charges of breaking and  entering. Patient feels that he can go and stay with his friends and find a job when discharged from the hospital. Should urine drug screen is positive for tetrahydrocannabinol. Patient denied recent use of other illicit drugs. Patient acetaminophen level is 138 on arrival. Patient is currently on involuntary commitment and meets criteria for inpatient psychiatric hospitalization when medically stable.  Past Psychiatric History: none reported.  Risk to Self: Suicidal Ideation: Yes-Currently Present Suicidal Intent: No Is patient at risk for suicide?: Yes Suicidal Plan?: Yes-Currently Present Specify Current Suicidal Plan: Pt cut wrist tonight Access to Means: Yes Specify Access to Suicidal Means: Access to knives What has been your use of drugs/alcohol within the last 12 months?: Pt denies How many times?: 0 Other Self Harm Risks: None Triggers for Past Attempts: None known Intentional Self Injurious Behavior: None Risk to Others: Homicidal Ideation: No Thoughts of Harm to Others: No Current Homicidal Intent: No Current Homicidal Plan: No Access to Homicidal Means: No Identified Victim: None History of harm to others?: No Assessment of Violence: None Noted Violent Behavior Description: Pt denies history of violence Does patient have access to weapons?: No Criminal Charges Pending?: Yes Describe Pending Criminal Charges: Possession of marijuana Does patient have a court date: Yes Court Date: 04/28/16 Prior Inpatient Therapy: Prior Inpatient Therapy: No Prior Therapy Dates: NA Prior Therapy Facilty/Provider(s): NA Reason for Treatment: NA Prior Outpatient Therapy: Prior Outpatient Therapy: Yes Prior Therapy Dates: Age 64 Prior Therapy Facilty/Provider(s): While incarcerated Reason for Treatment: Substance use Does patient have an ACCT team?: No Does patient have Intensive In-House Services?  : No Does patient have [facility] services? : No Does patient have [facility]  services?:  No  Past Medical History:  Past Medical History:  Diagnosis Date  . Polycystic kidney disease   . Polycystic kidney disease   . Tobacco abuse    History reviewed. No pertinent surgical history. Family History:  Family History  Problem Relation Age of Onset  . Family history unknown: Yes   Family Psychiatric  History: Patient does not know much about his family members except what was stated in the history of present illness. Patient stated his grandma was who adopted his mother and he does not know any details about his mother or no contact with any siblings. Social History:  History  Alcohol Use  . 1.2 - 1.8 oz/week  . 2 - 3 Cans of beer per week    Comment: occassionaly, last drink over a week ago      History  Drug Use No    Social History   Social History  . Marital status: Single    Spouse name: N/A  . Number of children: N/A  . Years of education: N/A   Social History Main Topics  . Smoking status: Current Some Day Smoker    Types: Cigarettes  . Smokeless tobacco: Never Used  . Alcohol use 1.2 - 1.8 oz/week    2 - 3 Cans of beer per week     Comment: occassionaly, last drink over a week ago   . Drug use: No  . Sexual activity: No   Other Topics Concern  . None   Social History Narrative  . None   Additional Social History:    Allergies:   Allergies  Allergen Reactions  . Ibuprofen Other (See Comments)    Kidney specialist advised him not to take it  . Nsaids     Kidney specialist advised him not to take it     Labs:  Results for orders placed or performed during the hospital encounter of 04/26/16 (from the past 48 hour(s))  Comprehensive metabolic panel     Status: Abnormal   Collection Time: 04/26/16 11:23 PM  Result Value Ref Range   Sodium 141 135 - 145 mmol/L   Potassium 4.0 3.5 - 5.1 mmol/L   Chloride 106 101 - 111 mmol/L   CO2 28 22 - 32 mmol/L   Glucose, Bld 113 (H) 65 - 99 mg/dL   BUN 9 6 - 20 mg/dL   Creatinine, Ser 0.93 0.61 -  1.24 mg/dL   Calcium 9.0 8.9 - 10.3 mg/dL   Total Protein 7.1 6.5 - 8.1 g/dL   Albumin 4.2 3.5 - 5.0 g/dL   AST 21 15 - 41 U/L   ALT 24 17 - 63 U/L   Alkaline Phosphatase 48 38 - 126 U/L   Total Bilirubin 0.5 0.3 - 1.2 mg/dL   GFR calc non Af Amer >60 >60 mL/min   GFR calc Af Amer >60 >60 mL/min    Comment: (NOTE) The eGFR has been calculated using the CKD EPI equation. This calculation has not been validated in all clinical situations. eGFR's persistently <60 mL/min signify possible Chronic Kidney Disease.    Anion gap 7 5 - 15  Ethanol     Status: None   Collection Time: 04/26/16 11:23 PM  Result Value Ref Range   Alcohol, Ethyl (B) <5 <5 mg/dL    Comment:        LOWEST DETECTABLE LIMIT FOR SERUM ALCOHOL IS 5 mg/dL FOR MEDICAL PURPOSES ONLY   Salicylate level     Status: None  Collection Time: 04/26/16 11:23 PM  Result Value Ref Range   Salicylate Lvl <1.5 2.8 - 30.0 mg/dL  Acetaminophen level     Status: Abnormal   Collection Time: 04/26/16 11:23 PM  Result Value Ref Range   Acetaminophen (Tylenol), Serum 138 (H) 10 - 30 ug/mL    Comment:        THERAPEUTIC CONCENTRATIONS VARY SIGNIFICANTLY. A RANGE OF 10-30 ug/mL MAY BE AN EFFECTIVE CONCENTRATION FOR MANY PATIENTS. HOWEVER, SOME ARE BEST TREATED AT CONCENTRATIONS OUTSIDE THIS RANGE. ACETAMINOPHEN CONCENTRATIONS >150 ug/mL AT 4 HOURS AFTER INGESTION AND >50 ug/mL AT 12 HOURS AFTER INGESTION ARE OFTEN ASSOCIATED WITH TOXIC REACTIONS.   cbc     Status: None   Collection Time: 04/26/16 11:23 PM  Result Value Ref Range   WBC 6.5 4.0 - 10.5 K/uL   RBC 4.82 4.22 - 5.81 MIL/uL   Hemoglobin 15.1 13.0 - 17.0 g/dL   HCT 42.2 39.0 - 52.0 %   MCV 87.6 78.0 - 100.0 fL   MCH 31.3 26.0 - 34.0 pg   MCHC 35.8 30.0 - 36.0 g/dL   RDW 12.6 11.5 - 15.5 %   Platelets 250 150 - 400 K/uL  Urinalysis, Routine w reflex microscopic     Status: Abnormal   Collection Time: 04/27/16 12:10 AM  Result Value Ref Range   Color,  Urine YELLOW YELLOW   APPearance CLOUDY (A) CLEAR   Specific Gravity, Urine 1.031 (H) 1.005 - 1.030   pH 6.0 5.0 - 8.0   Glucose, UA NEGATIVE NEGATIVE mg/dL   Hgb urine dipstick NEGATIVE NEGATIVE   Bilirubin Urine NEGATIVE NEGATIVE   Ketones, ur NEGATIVE NEGATIVE mg/dL   Protein, ur NEGATIVE NEGATIVE mg/dL   Nitrite NEGATIVE NEGATIVE   Leukocytes, UA NEGATIVE NEGATIVE  Acetaminophen level     Status: None   Collection Time: 04/27/16  6:17 AM  Result Value Ref Range   Acetaminophen (Tylenol), Serum 21 10 - 30 ug/mL    Comment:        THERAPEUTIC CONCENTRATIONS VARY SIGNIFICANTLY. A RANGE OF 10-30 ug/mL MAY BE AN EFFECTIVE CONCENTRATION FOR MANY PATIENTS. HOWEVER, SOME ARE BEST TREATED AT CONCENTRATIONS OUTSIDE THIS RANGE. ACETAMINOPHEN CONCENTRATIONS >150 ug/mL AT 4 HOURS AFTER INGESTION AND >50 ug/mL AT 12 HOURS AFTER INGESTION ARE OFTEN ASSOCIATED WITH TOXIC REACTIONS.   Comprehensive metabolic panel     Status: Abnormal   Collection Time: 04/27/16  6:17 AM  Result Value Ref Range   Sodium 136 135 - 145 mmol/L   Potassium 3.4 (L) 3.5 - 5.1 mmol/L   Chloride 106 101 - 111 mmol/L   CO2 23 22 - 32 mmol/L   Glucose, Bld 124 (H) 65 - 99 mg/dL   BUN 8 6 - 20 mg/dL   Creatinine, Ser 0.79 0.61 - 1.24 mg/dL   Calcium 8.2 (L) 8.9 - 10.3 mg/dL   Total Protein 6.0 (L) 6.5 - 8.1 g/dL   Albumin 3.5 3.5 - 5.0 g/dL   AST 17 15 - 41 U/L   ALT 22 17 - 63 U/L   Alkaline Phosphatase 35 (L) 38 - 126 U/L   Total Bilirubin 1.0 0.3 - 1.2 mg/dL   GFR calc non Af Amer >60 >60 mL/min   GFR calc Af Amer >60 >60 mL/min    Comment: (NOTE) The eGFR has been calculated using the CKD EPI equation. This calculation has not been validated in all clinical situations. eGFR's persistently <60 mL/min signify possible Chronic Kidney Disease.    Anion  gap 7 5 - 15  CBC     Status: None   Collection Time: 04/27/16  6:17 AM  Result Value Ref Range   WBC 7.0 4.0 - 10.5 K/uL   RBC 4.60 4.22 - 5.81  MIL/uL   Hemoglobin 14.7 13.0 - 17.0 g/dL   HCT 41.3 39.0 - 52.0 %   MCV 89.8 78.0 - 100.0 fL   MCH 32.0 26.0 - 34.0 pg   MCHC 35.6 30.0 - 36.0 g/dL   RDW 12.8 11.5 - 15.5 %   Platelets 230 150 - 400 K/uL  Glucose, capillary     Status: Abnormal   Collection Time: 04/27/16  7:41 AM  Result Value Ref Range   Glucose-Capillary 118 (H) 65 - 99 mg/dL   Comment 1 Notify RN    Comment 2 Document in Chart   Protime-INR     Status: None   Collection Time: 04/27/16  8:39 AM  Result Value Ref Range   Prothrombin Time 15.0 11.4 - 15.2 seconds   INR 1.17   APTT     Status: None   Collection Time: 04/27/16  8:39 AM  Result Value Ref Range   aPTT 25 24 - 36 seconds  Rapid urine drug screen (hospital performed)     Status: Abnormal   Collection Time: 04/27/16 10:04 AM  Result Value Ref Range   Opiates NONE DETECTED NONE DETECTED   Cocaine NONE DETECTED NONE DETECTED   Benzodiazepines NONE DETECTED NONE DETECTED   Amphetamines NONE DETECTED NONE DETECTED   Tetrahydrocannabinol POSITIVE (A) NONE DETECTED   Barbiturates NONE DETECTED NONE DETECTED    Comment:        DRUG SCREEN FOR MEDICAL PURPOSES ONLY.  IF CONFIRMATION IS NEEDED FOR ANY PURPOSE, NOTIFY LAB WITHIN 5 DAYS.        LOWEST DETECTABLE LIMITS FOR URINE DRUG SCREEN Drug Class       Cutoff (ng/mL) Amphetamine      1000 Barbiturate      200 Benzodiazepine   761 Tricyclics       950 Opiates          300 Cocaine          300 THC              50     Current Facility-Administered Medications  Medication Dose Route Frequency Provider Last Rate Last Dose  . 0.9 %  sodium chloride infusion   Intravenous Continuous Ivor Costa, MD 125 mL/hr at 04/27/16 0143    . acetylcysteine (ACETADOTE) 40,000 mg in dextrose 5 % 1,000 mL (40 mg/mL) infusion  15 mg/kg/hr Intravenous Continuous Dorrene German, RPH      . amLODipine (NORVASC) tablet 5 mg  5 mg Oral Daily Ivor Costa, MD   5 mg at 04/27/16 0905  . enoxaparin (LOVENOX) injection 40  mg  40 mg Subcutaneous Q24H Ivor Costa, MD      . hydrALAZINE (APRESOLINE) injection 5 mg  5 mg Intravenous Q2H PRN Ivor Costa, MD      . nicotine (NICODERM CQ - dosed in mg/24 hours) patch 21 mg  21 mg Transdermal Daily Ivor Costa, MD   21 mg at 04/27/16 0905  . ondansetron (ZOFRAN) injection 4 mg  4 mg Intravenous Q8H PRN Ivor Costa, MD   4 mg at 04/27/16 0331  . potassium chloride SA (K-DUR,KLOR-CON) CR tablet 40 mEq  40 mEq Oral Once [employer], DO      . senna-docusate (Senokot-S) tablet  1 tablet  1 tablet Oral QHS PRN Ivor Costa, MD      . sodium chloride flush (NS) 0.9 % injection 3 mL  3 mL Intravenous Q12H Ivor Costa, MD      . zolpidem (AMBIEN) tablet 5 mg  5 mg Oral QHS PRN Ivor Costa, MD        Musculoskeletal: Strength & Muscle Tone: within normal limits Gait & Station: unable to stand Patient leans: N/A  Psychiatric Specialty Exam: Physical Exam as per history and physical   ROS  No Fever-chills, No Headache, No changes with Vision or hearing, reports vertigo No problems swallowing food or Liquids, No Chest pain, Cough or Shortness of Breath, No Abdominal pain, No Nausea or Vommitting, Bowel movements are regular, No Blood in stool or Urine, No dysuria, No new skin rashes or bruises, No new joints pains-aches,  No new weakness, tingling, numbness in any extremity, No recent weight gain or loss, No polyuria, polydypsia or polyphagia,  A full 10 point Review of Systems was done, except as stated above, all other Review of Systems were negative.  Blood pressure (!) 151/100, pulse 75, temperature 98.3 F (36.8 C), temperature source Oral, resp. rate 16, height 5' 9"  (1.753 m), weight 79.4 kg (175 lb), SpO2 100 %.Body mass index is 25.84 kg/m.  General Appearance: Guarded  Eye Contact:  Good  Speech:  Clear and Coherent  Volume:  Normal  Mood:  Anxious, Depressed and Irritable  Affect:  Constricted and Depressed  Thought Process:  Coherent and Goal Directed   Orientation:  Full (Time, Place, and Person)  Thought Content:  Rumination  Suicidal Thoughts:  Yes.  with intent/plan  Homicidal Thoughts:  No  Memory:  Immediate;   Good Recent;   Fair Remote;   Fair  Judgement:  Impaired  Insight:  Fair  Psychomotor Activity:  Decreased  Concentration:  Concentration: Good and Attention Span: Good  Recall:  Good  Fund of Knowledge:  Good  Language:  Good  Akathisia:  Negative  Handed:  Right  AIMS (if indicated):     Assets:  Communication Skills Desire for Improvement Leisure Time Resilience Social Support Transportation  ADL's:  Intact  Cognition:  WNL  Sleep:        Treatment Plan Summary: This is a 26 years old male with multiple psychosocial stresses presented with the status post intentional drug overdose as a suicidal attempts after Ativan altercation with his grandmother. Patient is also suffering with relationship with his ex-girlfriend, transportation difficulties, loss of job and no driver's license, legal charges pending and history of being incarcerated.  Case discussed with LCSW who will contact behavioral [place] [facility] when patient is medically stable for acute psychiatric hospitalization Continue safety sitter Continue involuntary commitment. Recommended no psychiatric medication to this visit Daily contact with patient to assess and evaluate symptoms and progress in treatment and Medication management  Disposition: Recommend psychiatric Inpatient admission when medically cleared. Supportive therapy provided about ongoing stressors.  Ambrose Finland, MD 04/27/2016 1:13 PM

## 2016-04-27 NOTE — ED Notes (Signed)
Pt report called to floor and advised floor pt would be up when sitter is available to come with pt.

## 2016-04-27 NOTE — ED Notes (Addendum)
Pt. In burgundy scrubs. Pt. And belongings searched and wanded by security. Pt. Has 1 belongings bag and 1 black book bag. Pt. Belongings locked up at the nurses station in cabinet. Pt. Has 1 yellow jacket, 1 pr black socks, 1 pr. Black snickers, 1 gray t-shirt, 1 black [place] shorts,1 black cell phone and no wallet and no money.

## 2016-04-27 NOTE — Progress Notes (Signed)
MEDICATION RELATED CONSULT NOTE - follow-up  Pharmacy Consult for Acetadote Indication: Elevated Apap  Allergies  Allergen Reactions  . Ibuprofen Other (See Comments)    Kidney specialist advised him not to take it  . Nsaids     Kidney specialist advised him not to take it     Patient Measurements: Height:  (175.3 cm) Weight: 175 lb (79.4 kg) IBW/kg (Calculated) : 70.7   Vital Signs: Temp: 98.3 F (36.8 C) (04/23 0319) Temp Source: Oral (04/23 0319) BP: 151/100 (04/23 0319) Pulse Rate: 75 (04/23 0319) Intake/Output from previous day: 04/22 0701 - 04/23 0700 In: 1399.3 [I.V.:782.7; IV Piggyback:616.7] Out: -  Intake/Output from this shift: No intake/output data recorded.  Labs:  Recent Labs  04/26/16 2323 04/27/16 0617 04/27/16 0839  WBC 6.5 7.0  --   HGB 15.1 14.7  --   HCT 42.2 41.3  --   PLT 250 230  --   APTT  --   --  25  CREATININE 0.93 0.79  --   ALBUMIN 4.2 3.5  --   PROT 7.1 6.0*  --   AST 21 17  --   ALT 24 22  --   ALKPHOS 48 35*  --   BILITOT 0.5 1.0  --    Estimated Creatinine Clearance: 143.6 mL/min (by C-G formula based on SCr of 0.79 mg/dL).   Microbiology: No results found for this or any previous visit (from the past 720 hour(s)).  Medical History: Past Medical History:  Diagnosis Date  . Polycystic kidney disease   . Polycystic kidney disease   . Tobacco abuse     Medications:  Scheduled:  . amLODipine  5 mg Oral Daily  . enoxaparin (LOVENOX) injection  40 mg Subcutaneous Q24H  . nicotine  21 mg Transdermal Daily  . sodium chloride flush  3 mL Intravenous Q12H   Infusions:  . sodium chloride 125 mL/hr at 04/27/16 0143  . acetylcysteine      Assessment: 23 yoM tried to cut wrist d/t recent break up with girlfriend.  APAP level =138 (4/22 at 0200), pt will not disclose how much apap he took and when.  PC called and recommended Acetadote.   Acetadote bolus given 4/22 02:00 with infusion starting at  03:00  Today,04/27/2016   07:41 APAP = 21 mcg/mL  AST/ALT WNL  INR = 1.17  RN states Poison Control called asking for update 4/23am, no changes at this time   Goal of Therapy:  Prevent liver damage  Plan:   Continue acetadonte at 15 mg/kg/hr   Per physician, continue gtt until patient seen 4/24 am  Check PT/INR, LFTS, and APAP level in am   Juliette Alcide, PharmD, BCPS.   Pager: [phone] 04/27/2016 10:07 AM

## 2016-04-27 NOTE — BH Assessment (Addendum)
Tele Assessment Note   Mitchell Mcdonald is an 26 y.o. single male who presents unaccompanied to [name] ED after being petitioned by his grandmother, Mitchell Mcdonald. Affidavit and petition states: "Respondent recently broke up with his girlfriend and cut his wrist - not to the point causing a risk of death - and sent a picture of it to his girlfriend telling her she made him do it. Officers were called by petitioner and he stated he wanted to end his life to officers." According to report by law enforcement, Pt tried to cut his wrist and then ran out of the house. [employer] found the patient about 3 blocks from home and he refused and was combative when attempting to bring him in.  Pt was initially guarded and hesitant to answer questions. He acknowledges depressive symptoms including crying spells, social withdrawal, loss of interest in usual pleasures, fatigue, irritability, decreased concentration, decreased sleep, decreased appetite and feelings of hopelessness. He reports feeling anxious and worrying about his problems "every day." He admits he was having suicidal thoughts when he cut his wrist tonight. He denies any previous suicide attempts or intentional self-injurious behavior. He says his father committed suicide when Pt was age [age]. He denies current homicidal ideation or history of violence. He denies any history of psychotic symptoms. Pt reports he drinks alcohol socially and denies he drinks to excess. He says he has used recreational drugs in the past but denies any recent use. Pt's urine drug screen and blood alcohol level are in process.  Pt states he is having relationship problems with his girlfriend. 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 He says he has a court date 04/28/16 for a marijuana possession charge and says that he took responsibility so his girlfriend could  avoid legal charges. Pt says he has another court date for a traffic violation. Pt says he was living with his girlfriend but now is staying with his grandmother, but he believes she wants him out of the house. Pt cannot identify anyone in his life who is supportive. He says a friend was recently killed. Pt denies any history of abuse. Pt denies any history of inpatient or outpatient mental health treatment. He says he was incarcerated at age 63 for breaking and entering and received substance abuse treatment at that time.  Pt is dressed in hospital scrubs, alert, oriented x4 with soft speech and normal motor behavior. Eye contact is poor. Pt's mood is depressed and affect is congruent with mood. Thought process is coherent and relevant. There is no indication Pt is currently responding to internal stimuli or experiencing delusional thought content. Pt was generally cooperative but was reluctant to participate in assessment. He says does not want to be admitted to a psychiatric facility.    Diagnosis: Major Depressive Disorder, Single Episode, Severe Without Psychotic Features  Past Medical History:  Past Medical History:  Diagnosis Date  . Polycystic kidney disease   . Polycystic kidney disease     History reviewed. No pertinent surgical history.  Family History:  Family History  Problem Relation Age of Onset  . Family history unknown: Yes    Social History:  reports that he has been smoking Cigarettes.  He has never used smokeless tobacco. He reports that he drinks about 1.2 - 1.8 oz of alcohol per week . He reports  that he does not use drugs.  Additional Social History:  Alcohol / Drug Use Pain Medications: See MAR Prescriptions: See MAR Over the Counter: See MAR History of alcohol / drug use?: No history of alcohol / drug abuse (Pt report he has used drugs in the past but denies recent use) Longest period of sobriety (when/how long): NA  CIWA: CIWA-Ar BP: (!) 151/92 Pulse  Rate: 76 COWS:    PATIENT STRENGTHS: (choose at least two) Ability for insight Average or above average intelligence Capable of independent living Communication skills General fund of knowledge Physical Health  Allergies:  Allergies  Allergen Reactions  . Ibuprofen Other (See Comments)    Kidney specialist advised him not to take it  . Nsaids     Kidney specialist advised him not to take it     Home Medications:  (Not in a hospital admission)  OB/GYN Status:  No LMP for male patient.  General Assessment Data Location of Assessment: [facility] ED TTS Assessment: In system Is this a Tele or Face-to-Face Assessment?: Face-to-Face Is this an Initial Assessment or a Re-assessment for this encounter?: Initial Assessment Marital status: Single Maiden name: NA Is patient pregnant?: No Pregnancy Status: No Living Arrangements: Other relatives (Living with grandmother) Can pt return to current living arrangement?: Yes Admission Status: Involuntary Is patient capable of signing voluntary admission?: Yes Referral Source: Self/Family/Friend Insurance type: Self-pay     Crisis Care Plan Living Arrangements: Other relatives (Living with grandmother) Legal Guardian: Other: (Self) Name of Psychiatrist: None Name of Therapist: None  Education Status Is patient currently in school?: No Current Grade: NA Highest grade of school patient has completed: GED Name of school: NA Contact person: NA  Risk to self with the past 6 months Suicidal Ideation: Yes-Currently Present Has patient been a risk to self within the past 6 months prior to admission? : Yes Suicidal Intent: No Has patient had any suicidal intent within the past 6 months prior to admission? : No Is patient at risk for suicide?: Yes Suicidal Plan?: Yes-Currently Present Has patient had any suicidal plan within the past 6 months prior to admission? : Yes Specify Current Suicidal Plan: Pt cut wrist tonight Access to Means:  Yes Specify Access to Suicidal Means: Access to knives What has been your use of drugs/alcohol within the last 12 months?: Pt denies Previous Attempts/Gestures: No How many times?: 0 Other Self Harm Risks: None Triggers for Past Attempts: None known Intentional Self Injurious Behavior: None Family Suicide History: Yes (Father committed suicide in 63) Recent stressful life event(s): Conflict (Comment), Job Loss, Surveyor, quantity Problems, Legal Issues (Conflict with family and ex-girlfriend) Persecutory voices/beliefs?: No Depression: Yes Depression Symptoms: Despondent, Insomnia, Tearfulness, Isolating, Fatigue, Guilt, Loss of interest in usual pleasures, Feeling worthless/self pity, Feeling angry/irritable Substance abuse history and/or treatment for substance abuse?: Yes Suicide prevention information given to non-admitted patients: Not applicable  Risk to Others within the past 6 months Homicidal Ideation: No Does patient have any lifetime risk of violence toward others beyond the six months prior to admission? : No Thoughts of Harm to Others: No Current Homicidal Intent: No Current Homicidal Plan: No Access to Homicidal Means: No Identified Victim: None History of harm to others?: No Assessment of Violence: None Noted Violent Behavior Description: Pt denies history of violence Does patient have access to weapons?: No Criminal Charges Pending?: Yes Describe Pending Criminal Charges: Possession of marijuana Does patient have a court date: Yes Court Date: 04/28/16 Is patient on probation?: No  Psychosis Hallucinations: None noted Delusions: None noted  Mental Status Report Appearance/Hygiene: In scrubs Eye Contact: Poor Motor Activity: Unremarkable Speech: Logical/coherent Level of Consciousness: Alert Mood: Depressed Affect: Depressed Anxiety Level: Minimal Thought Processes: Relevant, Coherent Judgement: Unimpaired Orientation: Person, Place, Time, Situation,  Appropriate for developmental age Obsessive Compulsive Thoughts/Behaviors: None  Cognitive Functioning Concentration: Normal Memory: Recent Intact, Remote Intact IQ: Average Insight: Fair Impulse Control: Poor Appetite: Poor Weight Loss: 0 Weight Gain: 0 Sleep: Decreased Total Hours of Sleep: 4 Vegetative Symptoms: None  ADLScreening [facility] Assessment Services) Patient's cognitive ability adequate to safely complete daily activities?: Yes Patient able to express need for assistance with ADLs?: Yes Independently performs ADLs?: Yes (appropriate for developmental age)  Prior Inpatient Therapy Prior Inpatient Therapy: No Prior Therapy Dates: NA Prior Therapy Facilty/Provider(s): NA Reason for Treatment: NA  Prior Outpatient Therapy Prior Outpatient Therapy: Yes Prior Therapy Dates: Age 35 Prior Therapy Facilty/Provider(s): While incarcerated Reason for Treatment: Substance use Does patient have an ACCT team?: No Does patient have Intensive In-House Services?  : No Does patient have [facility] services? : No Does patient have P4CC services?: No  ADL Screening (condition at time of admission) Patient's cognitive ability adequate to safely complete daily activities?: Yes Is the patient deaf or have difficulty hearing?: No Does the patient have difficulty seeing, even when wearing glasses/contacts?: No Does the patient have difficulty concentrating, remembering, or making decisions?: No Patient able to express need for assistance with ADLs?: Yes Does the patient have difficulty dressing or bathing?: No Independently performs ADLs?: Yes (appropriate for developmental age) Does the patient have difficulty walking or climbing stairs?: No Weakness of Legs: None Weakness of Arms/Hands: None  Home Assistive Devices/Equipment Home Assistive Devices/Equipment: None    Abuse/Neglect Assessment (Assessment to be complete while patient is alone) Physical Abuse: Denies Verbal Abuse:  Denies Sexual Abuse: Denies Exploitation of patient/patient's resources: Denies Self-Neglect: Denies     Merchant navy officer (For Healthcare) Does Patient Have a Medical Advance Directive?: No Would patient like information on creating a medical advance directive?: No - Patient declined    Additional Information 1:1 In Past 12 Months?: No CIRT Risk: No Elopement Risk: Yes Does patient have medical clearance?: No     Disposition: Binnie Rail, AC at [facility], confirms adult unit is at capacity. Gave clinical report to Nira Conn, NP who said Pt meets criteria for inpatient psychiatric admission. Notified Dr. Elesa Massed who said Pt's labs indicate he has overdosed on Tylenol and may need medical admission.  Disposition Initial Assessment Completed for this Encounter: Yes Disposition of Patient: Inpatient treatment program Type of inpatient treatment program: Adult   Pamalee Leyden, [facility], [facility], [facility] Triage Specialist [phone]   Pamalee Leyden 04/27/2016 12:07 AM

## 2016-04-27 NOTE — H&P (Signed)
History and Physical    Mitchell Mcdonald ZOX:[id number] DOB: 1990-12-25 DOA: 04/26/2016  Referring MD/NP/PA:   PCP: Sissy Hoff, MD   Patient coming from:  The patient is coming from home.  At baseline, pt is independent for most of ADL.   Chief Complaint: Suicidal ideation and attempt  HPI: Mitchell Mcdonald is a 26 y.o. male with medical history significant of hypertension, hyperlipidemia, polycystic kidney disease, tobacco abuse, rhinitis, who presents with suicidal ideation and attempt  Per report, patient recently broke up with his girlfriend. Pt told his grandmother that he was going to kill himself, and he tried to cut his wrists and ran out of the house. [employer] found the patient about 3 blocks from home and he refused and was combative when attempting to bring him in. Mitchell Mcdonald has IVC's him for suicide and safety risk. Currently patient denies suicidal or homicidal ideations to me. Patient does not have chest pain, shortness of breath, nausea, vomiting, diarrhea, abdominal pain symptoms of UTI or unilateral weakness. No fever or chills. Patient was found to have Tylenol level 138. When asked when and how much Tylenol he took, he said he is not sure.  ED Course: pt was found to have normal liver function, salicylate level less than 7, negative urinalysis, electrolytes renal function okay, temperature normal, oxygen saturation 99% on room air, negative x-ray of her left hand for bony fracture. Patient is admitted to telemetry bed as inpatient.  Review of Systems:   General: no fevers, chills, no changes in body weight, has fatigue HEENT: no blurry vision, hearing changes or sore throat Respiratory: no dyspnea, coughing, wheezing CV: no chest pain, no palpitations GI: no nausea, vomiting, abdominal pain, diarrhea, constipation GU: no dysuria, burning on urination, increased urinary frequency, hematuria  Ext: no leg edema Neuro: no unilateral weakness, numbness, or tingling, no vision change  or hearing loss Skin: has a few shallow knife scratching marks. MSK: No muscle spasm, no deformity, no limitation of range of movement in spin Heme: No easy bruising.  Travel history: No recent long distant travel.  Allergy:  Allergies  Allergen Reactions  . Ibuprofen Other (See Comments)    Kidney specialist advised him not to take it  . Nsaids     Kidney specialist advised him not to take it     Past Medical History:  Diagnosis Date  . Polycystic kidney disease   . Polycystic kidney disease   . Tobacco abuse     History reviewed. No pertinent surgical history.  Social History:  reports that he has been smoking Cigarettes.  He has never used smokeless tobacco. He reports that he drinks about 1.2 - 1.8 oz of alcohol per week . He reports that he does not use drugs.  Family History: Patient was adopted, does not know any family medical history.  Family History  Problem Relation Age of Onset  . Family history unknown: Yes     Prior to Admission medications   Not on File    Physical Exam: Vitals:   04/27/16 0130 04/27/16 0200 04/27/16 0300 04/27/16 0319  BP: 131/79 (!) 138/102 (!) 149/99 (!) 151/100  Pulse: 63 65 64 75  Resp: Temp:    98.3 F (36.8 C)  TempSrc:    Oral  SpO2: 98% 100% 100% 100%  Weight:      Height:       General: Not in acute distress HEENT:       Eyes: PERRL, EOMI,  no scleral icterus.       ENT: No discharge from the ears and nose, no pharynx injection, no tonsillar enlargement.        Neck: No JVD, no bruit, no mass felt. Heme: No neck lymph node enlargement. Cardiac: S1/S2, RRR, No murmurs, No gallops or rubs. Respiratory:  No rales, wheezing, rhonchi or rubs. GI: Soft, nondistended, nontender, no rebound pain, no organomegaly, BS present. GU: No hematuria Ext: No pitting leg edema bilaterally. 2+DP/PT pulse bilaterally. Musculoskeletal: No joint deformities, No joint redness or warmth, no limitation of ROM in spin. Skin:   has a few shallow knife scratch marks. Neuro: Alert, oriented X3, cranial nerves II-XII grossly intact, moves all extremities normally.  Psych: Had suicidal but hemocidal ideation.  Labs on Admission: I have personally reviewed following labs and imaging studies  CBC:  Recent Labs Lab 04/26/16 2323 04/27/16 0617  WBC 6.5 7.0  HGB 15.1 14.7  HCT 42.2 41.3  MCV 87.6 89.8  PLT 250 230   Basic Metabolic Panel:  Recent Labs Lab 04/26/16 2323  NA 141  K 4.0  CL 106  CO2 28  GLUCOSE 113*  BUN 9  CREATININE 0.93  CALCIUM 9.0   GFR: Estimated Creatinine Clearance: 123.5 mL/min (by C-G formula based on SCr of 0.93 mg/dL). Liver Function Tests:  Recent Labs Lab 04/26/16 2323  AST 21  ALT 24  ALKPHOS 48  BILITOT 0.5  PROT 7.1  ALBUMIN 4.2   No results for input(s): LIPASE, AMYLASE in the last 168 hours. No results for input(s): AMMONIA in the last 168 hours. Coagulation Profile: No results for input(s): INR, PROTIME in the last 168 hours. Cardiac Enzymes: No results for input(s): CKTOTAL, CKMB, CKMBINDEX, TROPONINI in the last 168 hours. BNP (last 3 results) No results for input(s): PROBNP in the last 8760 hours. HbA1C: No results for input(s): HGBA1C in the last 72 hours. CBG: No results for input(s): GLUCAP in the last 168 hours. Lipid Profile: No results for input(s): CHOL, HDL, LDLCALC, TRIG, CHOLHDL, LDLDIRECT in the last 72 hours. Thyroid Function Tests: No results for input(s): TSH, T4TOTAL, FREET4, T3FREE, THYROIDAB in the last 72 hours. Anemia Panel: No results for input(s): VITAMINB12, FOLATE, FERRITIN, TIBC, IRON, RETICCTPCT in the last 72 hours. Urine analysis:    Component Value Date/Time   COLORURINE YELLOW 04/27/2016 0010   APPEARANCEUR CLOUDY (A) 04/27/2016 0010   LABSPEC 1.031 (H) 04/27/2016 0010   PHURINE 6.0 04/27/2016 0010   GLUCOSEU NEGATIVE 04/27/2016 0010   HGBUR NEGATIVE 04/27/2016 0010   BILIRUBINUR NEGATIVE 04/27/2016 0010    KETONESUR NEGATIVE 04/27/2016 0010   PROTEINUR NEGATIVE 04/27/2016 0010   UROBILINOGEN 0.2 01/22/2014 1400   NITRITE NEGATIVE 04/27/2016 0010   LEUKOCYTESUR NEGATIVE 04/27/2016 0010   Sepsis Labs: (procalcitonin:4,lacticidven:4) )No results found for this or any previous visit (from the past 240 hour(s)).   Radiological Exams on Admission: No results found.   EKG: Independently reviewed. Sinus rhythm, QTC 422, heart rate progression, nonspecific T-wave change Assessment/Plan Principal Problem:   Overdose on Tylenol Active Problems:   HYPERTENSION, BENIGN SYSTEMIC   Polycystic kidney   Tobacco abuse   Suicidal behavior with attempted self-injury (HCC)   Overdose on Tylenol: Currently clear function normal. Mental status normal. -Will admit to tele bed as inpt -Started on  N-acetylcysteine per pharmacy consult - Continue neuro checks, monitor for abdominal pain, nausea, vomiting, tinnitus, lays, pallor, lethargy or diaphoresis. -Repeat Tylenol level in AM -f/u CMP for LFT, INR/PTT, UDS -Supportive  care with IVF, antiemetics, Zofran  HTN:  bp 151/92. Not on meds at home -IV hydralazine when necessary -start amlodipine 5 mg daily  Polycystic kidney: Cre normal. -f/u renal by BMP of CMP  Tobacco abuse: -Did counseling about importance of quitting smoking -Nicotine patch  DVT ppx: SCD Code Status: Full code Family Communication: None at bed side. Disposition Plan:  Anticipate discharge back to previous home environment Consults called:  none Admission status:  Inpatient/tele     Date of Service 04/27/2016    Lorretta Harp [facility] Pager [phone]  If 7PM-7AM, please contact night-coverage [email or web address] Password [email or web address] 04/27/2016, 7:01 AM

## 2016-04-27 NOTE — Progress Notes (Signed)
MEDICATION RELATED CONSULT NOTE - INITIAL   Pharmacy Consult for Acetadote Indication: Elevated Apap  Allergies  Allergen Reactions  . Ibuprofen Other (See Comments)    Kidney specialist advised him not to take it  . Nsaids     Kidney specialist advised him not to take it     Patient Measurements: Height:  (175.3 cm) Weight: 175 lb (79.4 kg) IBW/kg (Calculated) : 70.7   Vital Signs: Temp: 98.6 F (37 C) (04/22 2238) Temp Source: Oral (04/22 2238) BP: 151/92 (04/22 2238) Pulse Rate: 76 (04/22 2238) Intake/Output from previous day: No intake/output data recorded. Intake/Output from this shift: No intake/output data recorded.  Labs:  Recent Labs  04/26/16 2323  WBC 6.5  HGB 15.1  HCT 42.2  PLT 250  CREATININE 0.93  ALBUMIN 4.2  PROT 7.1  AST 21  ALT 24  ALKPHOS 48  BILITOT 0.5   Estimated Creatinine Clearance: 123.5 mL/min (by C-G formula based on SCr of 0.93 mg/dL).   Microbiology: No results found for this or any previous visit (from the past 720 hour(s)).  Medical History: Past Medical History:  Diagnosis Date  . Polycystic kidney disease   . Polycystic kidney disease   . Tobacco abuse     Medications:  Scheduled:  . acetylcysteine  150 mg/kg Intravenous Once  . enalapril  5 mg Oral Daily  . Tdap  0.5 mL Intramuscular Once   Infusions:  . acetylcysteine    . sodium chloride      Assessment: 23 yoM tried to cut wrist d/t recent break up with girlfriend.  Apap=138, pt will not disclose how much apap he took and when.  PC called and recommended Acetadote.   Goal of Therapy:  Prevent liver damage  Plan:  Acetadonte 150 mg/kg over 1 hour Then 15 mg/kg/hr  f/u PT/INR/LFTS/additional apap levels  Lorenza Evangelist 04/27/2016,12:43 AM

## 2016-04-27 NOTE — Progress Notes (Signed)
  PROGRESS NOTE    Mitchell Mcdonald  ZOX:[id number] DOB: 1990/08/27 DOA: 04/26/2016 PCP: Sissy Hoff, MD   Chief Complaint  Patient presents with  . Suicidal  . IVC    Brief Narrative:  HPI on 04/27/2016 by Dr. Lorretta Harp Mitchell Mcdonald is a 26 y.o. male with medical history significant of hypertension, hyperlipidemia, polycystic kidney disease, tobacco abuse, rhinitis, who presents with suicidal ideation and attempt Per report, patient recently broke up with his girlfriend. Pt told his grandmother that he was going to kill himself, and he tried to cut his wrists and ran out of the house. [employer] found the patient about 3 blocks from home and he refused and was combative when attempting to bring him in. Mitchell Mcdonald has IVC's him for suicide and safety risk. Currently patient denies suicidal or homicidal ideations to me. Patient does not have chest pain, shortness of breath, nausea, vomiting, diarrhea, abdominal pain symptoms of UTI or unilateral weakness. No fever or chills. Patient was found to have Tylenol level 138. When asked when and how much Tylenol he took, he said he is not sure.  Assessment & Plan   Patient admitted today by Dr. Clyde Lundborg.   Tylenol overdose/side ideation/intent -Tylenol level on admission was 138 -patient was placed on N-acetylcysteine per pharmacy -Acetaminophen level monitored, currently 21 -INR 1.17 -LFTs WNL -Psychiatry consult and appreciated -Continue one-to-one sitter  Hypokalemia -Replaced, continue to monitor BMP  Essential hypertension -Currently on no medications at home -Amlodipine started  Polycystic kidney disease -Creatinine currently normal -Continue to monitor BMP  Tobacco abuse -Smoking cessation discussed, continue nicotine patch  DVT Prophylaxis  Lovenox  Code Status: Full  Family Communication: None bedside  Disposition Plan: Admitted, pending psychiatry consult  Consultants Psychiatry  Procedures  None  Antibiotics     Anti-infectives    None      LOS: 0 days   Time Spent in minutes   30 minutes  Mitchell Mcdonald D.O. on 04/27/2016 at 12:45 PM  Between 7am to 7pm - Pager - [phone]  After 7pm go to [email or web address] - password [email or web address]  And look for the night coverage person covering for me after hours  [facility] Office  [phone]

## 2016-04-27 NOTE — Care Management Note (Signed)
Case Management Note  Patient Details  Name: Joaquim Tolen MRN: [id number] Date of Birth: 1990/07/16  Subjective/Objective:    sucidice attempt  Patient is IVC'd               Action/Plan: Date:  April 27, 2016 Chart reviewed for concurrent status and case management needs. Will continue to follow patient progress. Discharge Planning: following for needs Expected discharge date: [id number] Marcelle Smiling, BSN, [place], [place]   [phone]  Expected Discharge Date:                  [facility] Dis[facility] Plan:  [facility]  In-House Referral:  Clinical Social Work  Discharge planning Services     Post Acute Care Choice:    Choice offered to:     DME Arranged:    DME Agency:     HH Arranged:    HH Agency:     Status of Service:  In process, will continue to follow  If discussed at Long Length of Stay Meetings, dates discussed:    Additional Comments:  Golda Acre, RN 04/27/2016, 11:02 AM

## 2016-04-27 NOTE — ED Notes (Signed)
Writer attempted blood work, X 2 unsuccessful.  

## 2016-04-28 ENCOUNTER — Encounter (HOSPITAL_COMMUNITY): Payer: Self-pay

## 2016-04-28 ENCOUNTER — Inpatient Hospital Stay
Admission: AD | Admit: 2016-04-28 | Discharge: 2016-05-02 | DRG: 881 | Disposition: A | Discharge status: Home / Self Care | Payer: No Typology Code available for payment source | Source: Intra-hospital | Attending: Psychiatry | Admitting: Psychiatry

## 2016-04-28 DIAGNOSIS — T391X1A Poisoning by 4-Aminophenol derivatives, accidental (unintentional), initial encounter: Secondary | ICD-10-CM

## 2016-04-28 DIAGNOSIS — Z818 Family history of other mental and behavioral disorders: Secondary | ICD-10-CM

## 2016-04-28 DIAGNOSIS — Q613 Polycystic kidney, unspecified: Secondary | ICD-10-CM

## 2016-04-28 DIAGNOSIS — F17210 Nicotine dependence, cigarettes, uncomplicated: Secondary | ICD-10-CM | POA: Diagnosis present

## 2016-04-28 DIAGNOSIS — F32A Depression, unspecified: Secondary | ICD-10-CM

## 2016-04-28 DIAGNOSIS — R45851 Suicidal ideations: Secondary | ICD-10-CM

## 2016-04-28 DIAGNOSIS — F1220 Cannabis dependence, uncomplicated: Secondary | ICD-10-CM | POA: Diagnosis present

## 2016-04-28 DIAGNOSIS — T1491XA Suicide attempt, initial encounter: Secondary | ICD-10-CM

## 2016-04-28 DIAGNOSIS — Z915 Personal history of self-harm: Secondary | ICD-10-CM

## 2016-04-28 DIAGNOSIS — Z720 Tobacco use: Secondary | ICD-10-CM | POA: Diagnosis present

## 2016-04-28 DIAGNOSIS — F329 Major depressive disorder, single episode, unspecified: Secondary | ICD-10-CM | POA: Diagnosis not present

## 2016-04-28 DIAGNOSIS — G4700 Insomnia, unspecified: Secondary | ICD-10-CM | POA: Diagnosis present

## 2016-04-28 DIAGNOSIS — I10 Essential (primary) hypertension: Secondary | ICD-10-CM | POA: Diagnosis present

## 2016-04-28 LAB — ACETAMINOPHEN LEVEL
Acetaminophen (Tylenol), Serum: <10 ug/mL — ABNORMAL LOW (ref 10–30)
Acetaminophen (Tylenol), Serum: <10 ug/mL — ABNORMAL LOW (ref 10–30)

## 2016-04-28 LAB — COMPREHENSIVE METABOLIC PANEL
ALBUMIN: 3.7 g/dL (ref 3.5–5.0)
ALBUMIN: 3.6 g/dL (ref 3.5–5.0)
ALT: 19 U/L (ref 17–63)
ALT: 18 U/L (ref 17–63)
AST: 14 U/L — AB (ref 15–41)
AST: 15 U/L (ref 15–41)
Alkaline Phosphatase: 45 U/L (ref 38–126)
Alkaline Phosphatase: 40 U/L (ref 38–126)
Anion gap: 6 (ref 5–15)
Anion gap: 7 (ref 5–15)
BILIRUBIN TOTAL: 0.7 mg/dL (ref 0.3–1.2)
BUN: 8 mg/dL (ref 6–20)
BUN: 10 mg/dL (ref 6–20)
CHLORIDE: 111 mmol/L (ref 101–111)
CO2: 22 mmol/L (ref 22–32)
CO2: 22 mmol/L (ref 22–32)
CREATININE: 0.76 mg/dL (ref 0.61–1.24)
Calcium: 8.7 mg/dL — ABNORMAL LOW (ref 8.9–10.3)
Calcium: 8.7 mg/dL — ABNORMAL LOW (ref 8.9–10.3)
Chloride: 111 mmol/L (ref 101–111)
Creatinine, Ser: 0.68 mg/dL (ref 0.61–1.24)
GFR calc Af Amer: >60 mL/min (ref >60)
GFR calc Af Amer: >60 mL/min (ref >60)
GFR calc non Af Amer: >60 mL/min (ref >60)
GLUCOSE: 101 mg/dL — AB (ref 65–99)
GLUCOSE: 100 mg/dL — AB (ref 65–99)
POTASSIUM: 3.8 mmol/L (ref 3.5–5.1)
POTASSIUM: 3.6 mmol/L (ref 3.5–5.1)
SODIUM: 139 mmol/L (ref 135–145)
Sodium: 140 mmol/L (ref 135–145)
TOTAL PROTEIN: 6.4 g/dL — AB (ref 6.5–8.1)
Total Bilirubin: 0.4 mg/dL (ref 0.3–1.2)
Total Protein: 6.5 g/dL (ref 6.5–8.1)

## 2016-04-28 LAB — GLUCOSE, CAPILLARY: Glucose-Capillary: 91 mg/dL (ref 65–99)

## 2016-04-28 LAB — CBC
HEMATOCRIT: 44.4 % (ref 39.0–52.0)
HEMOGLOBIN: 15.7 g/dL (ref 13.0–17.0)
MCH: 31.6 pg (ref 26.0–34.0)
MCHC: 35.4 g/dL (ref 30.0–36.0)
MCV: 89.3 fL (ref 78.0–100.0)
Platelets: 209 10*3/uL (ref 150–400)
RBC: 4.97 MIL/uL (ref 4.22–5.81)
RDW: 12.8 % (ref 11.5–15.5)
WBC: 6.6 10*3/uL (ref 4.0–10.5)

## 2016-04-28 LAB — PROTIME-INR
INR: 1.11
Prothrombin Time: 14.3 seconds (ref 11.4–15.2)

## 2016-04-28 MED ORDER — MAGNESIUM HYDROXIDE 400 MG/5ML PO SUSP: 30.0000 mL | Freq: Every day | ORAL | Status: DC | PRN

## 2016-04-28 MED ORDER — AMLODIPINE BESYLATE 5 MG PO TABS: 5.0000 mg | ORAL_TABLET | Freq: Every day | ORAL | 0 refills | Status: DC

## 2016-04-28 MED ORDER — NICOTINE 21 MG/24HR TD PT24: 21.0000 mg | MEDICATED_PATCH | Freq: Every day | TRANSDERMAL | 0 refills | Status: DC

## 2016-04-28 MED ORDER — ALUM & MAG HYDROXIDE-SIMETH 200-200-20 MG/5ML PO SUSP: 30.0000 mL | ORAL | Status: DC | PRN

## 2016-04-28 MED ORDER — NICOTINE 21 MG/24HR TD PT24
21.0000 mg | MEDICATED_PATCH | Freq: Every day | TRANSDERMAL | Status: DC
  Filled 2016-04-28 (×3): qty 1

## 2016-04-28 MED ORDER — SENNOSIDES-DOCUSATE SODIUM 8.6-50 MG PO TABS: 1.0000 | ORAL_TABLET | Freq: Every evening | ORAL | Status: DC | PRN

## 2016-04-28 MED ORDER — AMLODIPINE BESYLATE 5 MG PO TABS
5.0000 mg | ORAL_TABLET | Freq: Every day | ORAL | Status: DC
  Administered 2016-04-29: 5 mg via ORAL
  Filled 2016-04-28: qty 1

## 2016-04-28 NOTE — BH Assessment (Signed)
Pt accepted to [facility] inpatient unit by Dr.Hernandez. Attending Physician will be Dr. Ardyth Harps.   Patient has been assigned to room 306A, by [facility] Charge Nurse Monte Sereno.   Patient social worker has been informed of pt acceptance.  Registration currently with a pt. Clinician will call back at later time for pre-admit.

## 2016-04-28 NOTE — Progress Notes (Signed)
CSW assisting with patient discharge to psychiatric inpatient hospitalization. Will inform medical staff when bed is available.   Vivi Barrack, Theresia Majors, MSW Clinical Social Worker 5E and Psychiatric Service Line [phone] 04/28/2016  11:41 AM

## 2016-04-28 NOTE — Progress Notes (Signed)
Patient being transferred to [facility]. 2 IV's removed. Report called to [facility] at [facility] and patient waiting on transportation from [facility].

## 2016-04-28 NOTE — Progress Notes (Signed)
PROGRESS NOTE    Mitchell Mcdonald  ZOX:[id number] DOB: 1990/11/05 DOA: 04/26/2016 PCP: Sissy Hoff, MD   Chief Complaint  Patient presents with  . Suicidal  . IVC     Brief Narrative:  Admitted for Tylenol overdose/suicidal ideation. Currently medical stable for discharge to inpatient psych. Assessment & Plan   Tylenol overdose/Suicidal ideation -Tylenol level on admission was 138 -patient was placed on N-acetylcysteine per pharmacy- discontinued today -Acetaminophen level monitored, currently <10 -INR 1.17 -LFTs WNL -Psychiatry consult and appreciated, recommended inpatient psych once patient is medically stable -Continue one-to-one sitter --patient is medically stable  Hypokalemia -Resolved, continue to monitor BMP  Essential hypertension -Currently on no medications at home -Amlodipine started  Polycystic kidney disease -Creatinine currently normal -Continue to monitor BMP  Tobacco abuse/Polysubstance abuse -Smoking cessation discussed, continue nicotine patch -Tox screen +THC  DVT Prophylaxis  Lovenox  Code Status: Full  Family Communication: None bedside  Disposition Plan: Admitted, pending inpatient psych bed. Patient is medically stable for discharge  Consultants Psychiatry  Procedures  None  Antibiotics   Anti-infectives    None      Subjective:   Mitchell Mcdonald seen and examined today.  Denies further suicidal thoughts. Feels he can go home and take care of himself. Has a plan in order for a job and different living situation. Denies chest pain, shortness of breath, abdominal pain, N/V/D/C.  Objective:   Vitals:   04/27/16 2111 04/27/16 2210 04/28/16 0611 04/28/16 0852  BP: (!) 166/101 (!) 148/85 (!) 133/56 135/78  Pulse: 75  68 78  Resp: Temp: 98.3 F (36.8 C)  97.7 F (36.5 C)   TempSrc: Oral  Oral   SpO2: 100%  100% 99%  Weight:      Height:        Intake/Output Summary (Last 24 hours) at 04/28/16 1232 Last  data filed at 04/28/16 1000  Gross per 24 hour  Intake          4231.72 ml  Output                0 ml  Net          4231.72 ml   Filed Weights   04/26/16 2242  Weight: 79.4 kg (175 lb)    Exam  General: Well developed, well nourished, NAD, appears stated age  HEENT: NCAT, mucous membranes moist.   Cardiovascular: S1 S2 auscultated, no rubs, murmurs or gallops. Regular rate and rhythm.  Respiratory: Clear to auscultation bilaterally with equal chest rise  Abdomen: Soft, nontender, nondistended, + bowel sounds  Extremities: warm dry without cyanosis clubbing or edema  Neuro: AAOx3, nonfocal  Psych: upset    Data Reviewed: I have personally reviewed following labs and imaging studies  CBC:  Recent Labs Lab 04/26/16 2323 04/27/16 0617 04/28/16 0718  WBC 6.5 7.0 6.6  HGB 15.1 14.7 15.7  HCT 42.2 41.3 44.4  MCV 87.6 89.8 89.3  PLT 250 230 209   Basic Metabolic Panel:  Recent Labs Lab 04/26/16 2323 04/27/16 0617 04/28/16 0058 04/28/16 0718  NA 141 136 139 140  K 4.0 3.4* 3.6 3.8  CL 106 106 111 111  CO2 GLUCOSE 113* 124* 100* 101*  BUN CREATININE 0.93 0.79 0.76 0.68  CALCIUM 9.0 8.2* 8.7* 8.7*   GFR: Estimated Creatinine Clearance: 143.6 mL/min (by C-G formula based on SCr of 0.68 mg/dL). Liver Function Tests:  Recent Labs  Lab 04/26/16 2323 04/27/16 0617 04/28/16 0058 04/28/16 0718  AST 14*  ALT ALKPHOS 48 35* 45 40  BILITOT 0.5 1.0 0.4 0.7  PROT 7.1 6.0* 6.5 6.4*  ALBUMIN 4.2 3.5 3.6 3.7   No results for input(s): LIPASE, AMYLASE in the last 168 hours. No results for input(s): AMMONIA in the last 168 hours. Coagulation Profile:  Recent Labs Lab 04/27/16 0839 04/28/16 0718  INR 1.17 1.11   Cardiac Enzymes: No results for input(s): CKTOTAL, CKMB, CKMBINDEX, TROPONINI in the last 168 hours. BNP (last 3 results) No results for input(s): PROBNP in the last 8760 hours. HbA1C: No results for  input(s): HGBA1C in the last 72 hours. CBG:  Recent Labs Lab 04/27/16 0741 04/28/16 0725  GLUCAP 118* 91   Lipid Profile: No results for input(s): CHOL, HDL, LDLCALC, TRIG, CHOLHDL, LDLDIRECT in the last 72 hours. Thyroid Function Tests: No results for input(s): TSH, T4TOTAL, FREET4, T3FREE, THYROIDAB in the last 72 hours. Anemia Panel: No results for input(s): VITAMINB12, FOLATE, FERRITIN, TIBC, IRON, RETICCTPCT in the last 72 hours. Urine analysis:    Component Value Date/Time   COLORURINE YELLOW 04/27/2016 0010   APPEARANCEUR CLOUDY (A) 04/27/2016 0010   LABSPEC 1.031 (H) 04/27/2016 0010   PHURINE 6.0 04/27/2016 0010   GLUCOSEU NEGATIVE 04/27/2016 0010   HGBUR NEGATIVE 04/27/2016 0010   BILIRUBINUR NEGATIVE 04/27/2016 0010   KETONESUR NEGATIVE 04/27/2016 0010   PROTEINUR NEGATIVE 04/27/2016 0010   UROBILINOGEN 0.2 01/22/2014 1400   NITRITE NEGATIVE 04/27/2016 0010   LEUKOCYTESUR NEGATIVE 04/27/2016 0010   Sepsis Labs: (procalcitonin:4,lacticidven:4)  )No results found for this or any previous visit (from the past 240 hour(s)).    Radiology Studies: No results found.   Scheduled Meds: . amLODipine  5 mg Oral Daily  . enoxaparin (LOVENOX) injection  40 mg Subcutaneous Q24H  . nicotine  21 mg Transdermal Daily  . sodium chloride flush  3 mL Intravenous Q12H   Continuous Infusions: . sodium chloride 125 mL/hr at 04/28/16 0843     LOS: 1 day   Time Spent in minutes   30 minutes  Ashtin Melichar D.O. on 04/28/2016 at 12:32 PM  Between 7am to 7pm - Pager - [phone]  After 7pm go to [email or web address] - password [email or web address]  And look for the night coverage person covering for me after hours  [facility] Office  [phone]

## 2016-04-28 NOTE — Progress Notes (Signed)
Pt admitted to [facility] from [facility] in scrubs with steady gait. Alert and oriented. Skin and contraband search completed with this nurse and Jamesetta So, RN present. No contraband found. Skin issues noted include superficial scabbed cuts to left wrist, one small burn to left AC, healing rash to R/L abdomen/rib cage. PT reports he took and overdose of tylenol and cut his left wrist in an attempt to kill himself. States he did this because "I need to kick some people out of my life." Reports recent conflict with his grandmother, whom he has recently been living with "because she stays on me about my past, when I was 89, I went to jail for breaking and entering, and she stays on me about that." Pt reports he recently broke up with his girlfriend which left him without transportation, so he lost his job at [employer] due to not being able to get to work. Pt reports he is supposed to take enalapril at home for hypertension and polycystic kidney disease, but he is is not compliant. Denies SI/HI/AVH, pain at this time. Verbally contracts for safety while here in the hospital and is already questioning when he might be able to discharge. States, "I have a buddy that has a room and job lined up for me once I'm discharged." Pt oriented to room/unit/call light. Provided hygiene products, clean scrubs/linens for shower. Educated on low fall risk safety. Support and encouragement provided. Safety maintained with every 15 minute checks. Will continue to monitor.

## 2016-04-28 NOTE — Discharge Summary (Signed)
Physician Discharge Summary  Mitchell Mcdonald ONG:[id number] DOB: 02/09/1990 DOA: 04/26/2016  PCP: Mitchell Hoff, MD  Admit date: 04/26/2016 Discharge date: 04/28/2016  Time spent: 45 minutes  Recommendations for Outpatient Follow-up:  Patient will be discharged to [facility].    Discharge Diagnoses:  Tylenol overdose/Suicidal ideation Hypokalemia Essential hypertension Polycystic kidney disease Tobacco abuse/Polysubstance abuse  Discharge Condition: Stable  Diet recommendation: Heart healthy  Filed Weights   04/26/16 2242  Weight: 79.4 kg (175 lb)    History of present illness:  on 04/27/2016 by Dr. Mattie Marlin Mcdonald a 25 y.o.malewith medical history significant of hypertension, hyperlipidemia, polycystic kidney disease, tobacco abuse, rhinitis, who presents with suicidal ideation and attempt Per report, patient recently broke up with his girlfriend. Pt told his grandmother that he was going to kill himself, and he tried to cut his wrists and ran out of the house. [employer] found the patient about 3 blocks from home and he refused and was combative when attempting to bring him in. Mitchell Mcdonald has IVC's him for suicide and safety risk. Currently patient denies suicidal or homicidal ideations to me. Patient does not have chest pain, shortness of breath,nausea, vomiting, diarrhea, abdominal pain symptoms of UTI or unilateral weakness. No fever or chills. Patient was found to have Tylenol level 138. Whenasked when and how much Tylenol he took, he said he is not sure.  Hospital Course:  Tylenol overdose/Suicidal ideation -Tylenol level on admission was 138 -patient was placed on N-acetylcysteine per pharmacy- discontinued today -Acetaminophen level monitored, currently <10 -INR 1.17 -LFTs WNL -Psychiatry consult and appreciated, recommended inpatient psych once patient is medically stable -had one-to-one sitter -patient is medically stable for  discharge  Hypokalemia -Resolved  Essential hypertension -Currently on no medications at home -Amlodipine started  Polycystic kidney disease -Creatinine currently normal  Tobacco abuse/Polysubstance abuse -Smoking cessation discussed, continue nicotine patch -Tox screen +THC  Procedures: None  Consultations: Psychiatry   Discharge Exam: Vitals:   04/28/16 1421 04/28/16 1422  BP: (!) 139/96 139/86  Pulse: 75   Resp: 16   Temp: 98.9 F (37.2 C)    Denies further suicidal thoughts. Feels he can go home and take care of himself. Has a plan in order for a job and different living situation. Denies chest pain, shortness of breath, abdominal pain, N/V/D/C.  Exam  General: Well developed, well nourished, NAD  HEENT: NCAT, mucous membranes moist.   Cardiovascular: S1 S2 auscultated,RRR, no murmurs   Respiratory: Clear to auscultation bilaterally with equal chest rise  Abdomen: Soft, nontender, nondistended, + bowel sounds  Extremities: warm dry without cyanosis clubbing or edema  Neuro: AAOx3, nonfocal  Psych: Upset that he has to go to inpatient psych  Discharge Instructions  There are no discharge medications for this patient.  Allergies  Allergen Reactions  . Ibuprofen Other (See Comments)    Kidney specialist advised him not to take it  . Nsaids     Kidney specialist advised him not to take it       The results of significant diagnostics from this hospitalization (including imaging, microbiology, ancillary and laboratory) are listed below for reference.    Significant Diagnostic Studies: No results found.  Microbiology: No results found for this or any previous visit (from the past 240 hour(s)).   Labs: Basic Metabolic Panel:  Recent Labs Lab 04/26/16 2323 04/27/16 0617 04/28/16 0058 04/28/16 0718  NA 141 136 139 140  K 4.0 3.4* 3.6 3.8  CL 106 106 111 111  CO2 GLUCOSE 113* 124* 100* 101*  BUN CREATININE  0.93 0.79 0.76 0.68  CALCIUM 9.0 8.2* 8.7* 8.7*   Liver Function Tests:  Recent Labs Lab 04/26/16 2323 04/27/16 0617 04/28/16 0058 04/28/16 0718  AST 14*  ALT ALKPHOS 48 35* 45 40  BILITOT 0.5 1.0 0.4 0.7  PROT 7.1 6.0* 6.5 6.4*  ALBUMIN 4.2 3.5 3.6 3.7   No results for input(s): LIPASE, AMYLASE in the last 168 hours. No results for input(s): AMMONIA in the last 168 hours. CBC:  Recent Labs Lab 04/26/16 2323 04/27/16 0617 04/28/16 0718  WBC 6.5 7.0 6.6  HGB 15.1 14.7 15.7  HCT 42.2 41.3 44.4  MCV 87.6 89.8 89.3  PLT 250 230 209   Cardiac Enzymes: No results for input(s): CKTOTAL, CKMB, CKMBINDEX, TROPONINI in the last 168 hours. BNP: BNP (last 3 results) No results for input(s): BNP in the last 8760 hours.  ProBNP (last 3 results) No results for input(s): PROBNP in the last 8760 hours.  CBG:  Recent Labs Lab 04/27/16 0741 04/28/16 0725  GLUCAP 118* 91       Signed:  Nicolette Mcdonald  [facility] 04/28/2016, 2:40 PM

## 2016-04-28 NOTE — Tx Team (Signed)
Initial Treatment Plan 04/28/2016 4:51 PM [name] ZOX:[id number]    PATIENT STRESSORS: Financial difficulties Marital or family conflict Medication change or noncompliance Occupational concerns   PATIENT STRENGTHS: Ability for insight Capable of independent living General fund of knowledge Work skills   PATIENT IDENTIFIED PROBLEMS:   Suicidal ideation-"I overdosed on tylenol." Cuts to Left wrist    "I recently broke up with my girlfriend, my grandmother stays on me about something I did when I was 26 y/o."    Lost job            DISCHARGE CRITERIA:  Adequate post-discharge living arrangements Improved stabilization in mood, thinking, and/or behavior Medical problems require only outpatient monitoring Motivation to continue treatment in a less acute level of care Verbal commitment to aftercare and medication compliance  PRELIMINARY DISCHARGE PLAN: Outpatient therapy Placement in alternative living arrangements  PATIENT/FAMILY INVOLVEMENT: This treatment plan has been presented to and reviewed with the patient, Mitchell Mcdonald.  The patient and family have been given the opportunity to ask questions and make suggestions.  Tonye Pearson, RN 04/28/2016, 4:51 PM

## 2016-04-28 NOTE — Progress Notes (Addendum)
Patient has been accepted to [facility], Attending/Accepting Dr. Ardyth Harps Room 306 A.  Patient under IVC.  Physician informed. Patient informed.  RN given number for report.  Sheriff called for Transport.  CSW faxed absent note to [facility] office.for patient. Copy placed on chart.    Vivi Barrack, Theresia Majors, MSW Clinical Social Worker 5E and Psychiatric Service Line [phone] 04/28/2016  2:11 PM

## 2016-04-28 NOTE — Discharge Instructions (Signed)
Drug Overdose  A drug overdose happens when you take too much of a drug. An overdose can occur with illegal drugs, prescription drugs, or over-the-counter (OTC) drugs.  The effects of a drug overdose can be mild, dangerous, or even deadly.  What are the causes?  This condition may be caused by:  · Taking too much of a drug by accident.  · Taking too much of a drug on purpose.  · An error made by a health care provider who prescribes a drug.  · An error made by the pharmacist who fills the prescription order.    Drugs that commonly cause overdose include:  · Mental health drugs.  · Pain medicines.  · Illegal drugs.  · OTC cough and cold medicines.  · Heart medicines.  · Seizure medicines.    What increases the risk?  A drug overdose is more likely in:  · Children. They may be attracted to colorful pills. Because of a child's small size, even a small amount of a drug can be dangerous.  · Elderly people. They may be taking many different drugs. Elderly people may have difficulty reading labels or remembering when they last took their medicine.    The risk of a drug overdose is also higher for someone who:  · Takes illegal drugs.  · Takes a drug and drinks alcohol.  · Has a mental health condition.    What are the signs or symptoms?  Symptoms of a drug overdose depend on the drug and the amount that was taken. Common danger signs include:  · Behavior changes.  · Sleepiness.  · Slowed breathing.  · Nausea and vomiting.  · Seizures.  · Changes in eye pupil size. The pupil may be very large or very small.    If there are signs and symptoms of very low blood pressure (shock) from an overdose, emergency treatment is required. These include:  · Cold and clammy skin.  · Pale skin.  · Blue lips.  · Very slow breathing.  · Extreme sleepiness.  · Loss of consciousness.    How is this diagnosed?  This condition may be diagnosed based on your symptoms. It is important to tell your health care provider:  · All of the drugs that you  took.  · When you took the drugs.  · Whether you were drinking alcohol.    Your health care provider will do a physical exam. This exam may include:  · Checking and monitoring your heart rate and rhythm, your temperature, and your blood pressure (vital signs).  · Checking your breathing and oxygen level.    You may also have tests, including:  · Urine tests to check for drugs in your system.  · Blood tests to check for:  ? Drugs in your system.  ? Signs of an imbalance of your blood minerals (electrolytes).  ? Liver damage.  ? Kidney damage.    How is this treated?  Supporting your vital signs and your breathing is the first step in treating a drug overdose. Treatment may also include:  · Giving fluids and electrolytes through an IV tube.  · Inserting a breathing tube (endotracheal tube) in your airway to help you breathe.  · Passing a tube through your nose and into your stomach (NG tube, or nasogastric tube) to wash out your stomach.  · Giving medicines that:  ? Make you vomit.  ? Absorb any medicine that is left in your digestive system.  ? Block   or reverse the effect of the drug that caused the overdose.  · Filtering your blood through an artificial kidney machine (hemodialysis). You may need this if your overdose is severe or if you have kidney failure.  · Ongoing counseling and mental health support if you intentionally overdosed or used an illegal drug.    Follow these instructions at home:  · Take medicines only as directed by your health care provider. Always ask your health care provider about possible side effects of any new drug that you start taking.  · Keep a list of all of the drugs that you take, including over-the-counter medicines. Bring this list with you to all of your medical visits.  · Drink enough fluid to keep your urine clear or pale yellow.  · Keep all follow-up visits as directed by your health care provider. This is important.  How is this prevented?  · Get help if you are struggling  with:  ? Alcohol or drug use.  ? Depression or another mental health problem.  · Keep the phone number of your local poison control center near your phone or on your cell phone.  · Store all medicines in safety containers that are out of the reach of children.  · Read the drug inserts that come with your medicines.  · Do not use illegal drugs.  · Do not drink alcohol when taking drugs.  · Do not take medicines that are not prescribed for you.  Contact a health care provider if:  · Your symptoms return.  · You develop new symptoms or side effects when you take medicines.  Get help right away if:  · You think that you or someone else may have taken too much of a drug. The hotline of the National Poison Control Center is (800) 222-1222.  · You or someone else is having symptoms of a drug overdose.  · You have serious thoughts about hurting yourself or others.  · You become confused.  · You have:  ? Chest pain.  ? Difficulty breathing.  ? A loss of consciousness.  Drug overdose is an emergency. Do not wait to see if the symptoms will go away. Get medical help right away. Call your local emergency services (911 in the [place] Do not drive yourself to the hospital.  This information is not intended to replace advice given to you by your health care provider. Make sure you discuss any questions you have with your health care provider.  Document Released: 05/08/2014 Document Revised: 05/03/2015 Document Reviewed: 12/27/2013  Elsevier Interactive Patient Education © 2017 [employer]

## 2016-04-28 NOTE — BH Assessment (Signed)
Pt referred for [facility] placement by Joni Reining, SW. Pt chart currently under review by [facility] unit charge RN Jamesetta So) for possible [facility] placement. Clinician has requested IVC papers be faxed [phone]).

## 2016-04-28 NOTE — Progress Notes (Signed)
Poison control updated on patient's vitals and lab work. According to poison control, acetylcysteine infusion can be stopped. On call MD notified.

## 2016-04-29 ENCOUNTER — Encounter: Payer: Self-pay | Admitting: Psychiatry

## 2016-04-29 DIAGNOSIS — F1220 Cannabis dependence, uncomplicated: Secondary | ICD-10-CM

## 2016-04-29 DIAGNOSIS — F329 Major depressive disorder, single episode, unspecified: Principal | ICD-10-CM

## 2016-04-29 DIAGNOSIS — F32A Depression, unspecified: Secondary | ICD-10-CM

## 2016-04-29 LAB — TSH: TSH: 1.753 u[IU]/mL (ref 0.350–4.500)

## 2016-04-29 MED ORDER — ENALAPRIL MALEATE 5 MG PO TABS
5.0000 mg | ORAL_TABLET | Freq: Every day | ORAL | Status: DC
  Administered 2016-04-30 – 2016-05-01 (×2): 5 mg via ORAL
  Filled 2016-04-29 (×2): qty 1

## 2016-04-29 MED ORDER — DIPHENHYDRAMINE HCL 25 MG PO CAPS: 50.0000 mg | ORAL_CAPSULE | Freq: Every evening | ORAL | Status: DC | PRN

## 2016-04-29 NOTE — Progress Notes (Signed)
Pt pleasant and cooperative. Pt denies SI, HI, AVH. Pt reports wanting to get out of here. Reports this is not the place for him he [place] than in [place]. Pt noted smiling and talking with peers in no distress.  Encouragement and support offered. Safety checks maintained.  Pt remains safe on unit with q 15 min checks.

## 2016-04-29 NOTE — [facility] Counselor (Signed)
Adult Comprehensive Assessment  Patient ID: Mitchell Mcdonald, male   DOB: 06-13-1990, 26 y.o.   MRN: [id number]  Information Source: Information source: Patient  Current Stressors:  Employment / Job issues: lost job due to loss of transportation Family Relationships: stress with grandmother Housing / Lack of housing: had to leave girlfriend's home and move back in with grandmother Social relationships: recent break up with a girlfriend Bereavement / Loss: childhood friend was killed 2 weeks ago  Living/Environment/Situation:  Living Arrangements: Other relatives (grandmother) Living conditions (as described by patient or guardian): grandmother contstantly telling me what I do wrong How long has patient lived in current situation?: 2 weeks What is atmosphere in current home:  (negative)  Family History:  Marital status: Single Are you sexually active?: Yes What is your sexual orientation?: heterosexual Does patient have children?: No  Childhood History:  Additional childhood history information: Raised by grandmother.  Father committed suicide when Mitchell Mcdonald was 4.  Mother also left when Mitchell Mcdonald was young--unsure of the circumstances. Description of patient's relationship with caregiver when they were a child: Good relationship with grandmother. Patient's description of current relationship with people who raised him/her: conflictual relationship with grandmother. How were you disciplined when you got in trouble as a child/adolescent?: appropriate discipline Does patient have siblings?: No (Mitchell Mcdonald not sure of possible siblings from dad's side) Did patient suffer any verbal/emotional/physical/sexual abuse as a child?: No Did patient suffer from severe childhood neglect?: No Has patient ever been sexually abused/assaulted/raped as an adolescent or adult?: No Was the patient ever a victim of a crime or a disaster?: No Witnessed domestic violence?: No Has patient been effected by domestic violence as an  adult?: No  Education:  Highest grade of school patient has completed: GED Currently a student?: No Learning disability?: No  Employment/Work Situation:   Employment situation: Unemployed (just lost job at [employer]) Patient's job has been impacted by current illness: No What is the longest time patient has a held a job?: 1 year Where was the patient employed at that time?: [employer] Has patient ever been in the [employer]?: No Are There Guns or Other Weapons in Your Home?: No  Financial Resources:   Surveyor, quantity resources: No income (Mitchell Mcdonald has one more check from last job) Does patient have a Lawyer or guardian?: No  Alcohol/Substance Abuse:   What has been your use of drugs/alcohol within the last 12 months?: alcohol:1x per week, 5-6 beers/ 5-6 shots, since age 32. THC: 1x per week, one blunt. since age 27. If attempted suicide, did drugs/alcohol play a role in this?: No Alcohol/Substance Abuse Treatment Hx:  (drug treatment in prison) Has alcohol/substance abuse ever caused legal problems?: Yes (drug charges)  Social Support System:   Patient's Community Support System: Poor Describe Community Support System: two friends in [place], grandmother Type of faith/religion: none How does patient's faith help to cope with current illness?: NA  Leisure/Recreation:   Leisure and Hobbies: basketball  Strengths/Needs:   What things does the patient do well?: regular employment In what areas does patient struggle / problems for patient: letting people stress me out  Discharge Plan:   Does patient have access to transportation?: Yes (friend) Will patient be returning to same living situation after discharge?: No Plan for living situation after discharge: Mitchell Mcdonald reports he will be living with friends upon discharge Currently receiving community mental health services: No If no, would patient like referral for services when discharged?: No Does patient have financial barriers  related  to discharge medications?: Yes Patient description of barriers related to discharge medications: Mitchell Mcdonald has no insurance  Summary/Recommendations:   Summary and Recommendations (to be completed by the evaluator): Mitchell Mcdonald is 26 year old male from [place].  Mitchell Mcdonald is diagnosed with major depressive disorder and was admitted after a suicide attempt/gesture.  Recommendations for Mitchell Mcdonald include crisis stabilization, therapeutic milieu, attend and participate in groups, medication management, and development of comprehensive mental wellness plan.  Mitchell Mcdonald will be referred to outpatient services upon discharge.  Lorri Frederick. 04/29/2016

## 2016-04-29 NOTE — Plan of Care (Signed)
Problem: Pain Managment: Goal: General experience of comfort will improve Outcome: Progressing No pain   

## 2016-04-29 NOTE — Tx Team (Signed)
Interdisciplinary Treatment and Diagnostic Plan Update  04/29/2016 Time of Session: [place] MRN: [id number]  Principal Diagnosis: Depression  Secondary Diagnoses: Principal Problem:   Unspecified depressive disorder Active Problems:   Overdose on Tylenol   Tobacco abuse   PKD (polycystic kidney disease)   Hypertensive disorder   Cannabis use disorder, moderate, dependence (HCC)   Current Medications:  Current Facility-Administered Medications  Medication Dose Route Frequency Provider Last Rate Last Dose  . alum & mag hydroxide-simeth (MAALOX/MYLANTA) 200-200-20 MG/5ML suspension 30 mL  30 mL Oral Q4H PRN Hildred Priest, MD      . diphenhydrAMINE (BENADRYL) capsule 50 mg  50 mg Oral QHS PRN Hildred Priest, MD      . Derrill Memo ON 04/30/2016] enalapril (VASOTEC) tablet 5 mg  5 mg Oral Daily Hildred Priest, MD      . magnesium hydroxide (MILK OF MAGNESIA) suspension 30 mL  30 mL Oral Daily PRN Hildred Priest, MD      . nicotine (NICODERM CQ - dosed in mg/24 hours) patch 21 mg  21 mg Transdermal Daily Hildred Priest, MD      . senna-docusate (Senokot-S) tablet 1 tablet  1 tablet Oral QHS PRN Hildred Priest, MD       PTA Medications: Prescriptions Prior to Admission  Medication Sig Dispense Refill Last Dose  . amLODipine (NORVASC) 5 MG tablet Take 1 tablet (5 mg total) by mouth daily. 30 tablet 0   . nicotine (NICODERM CQ - DOSED IN MG/24 HOURS) 21 mg/24hr patch Place 1 patch (21 mg total) onto the skin daily. 28 patch 0     Patient Stressors: Financial difficulties Marital or family conflict Medication change or noncompliance Occupational concerns  Patient Strengths: Ability for insight Capable of independent living General fund of knowledge Work skills  Treatment Modalities: Medication Management, Group therapy, Case management,  1 to 1 session with clinician, Psychoeducation, Recreational  therapy.   Physician Treatment Plan for Primary Diagnosis: Depression Long Term Goal(s): Improvement in symptoms so as ready for discharge Improvement in symptoms so as ready for discharge   Short Term Goals: Ability to identify changes in lifestyle to reduce recurrence of condition will improve Ability to demonstrate self-control will improve Ability to identify triggers associated with substance abuse/mental health issues will improve Ability to identify and develop effective coping behaviors will improve  Medication Management: Evaluate patient's response, side effects, and tolerance of medication regimen.  Therapeutic Interventions: 1 to 1 sessions, Unit Group sessions and Medication administration.  Evaluation of Outcomes: Not Met  Physician Treatment Plan for Secondary Diagnosis: Principal Problem:   Unspecified depressive disorder Active Problems:   Overdose on Tylenol   Tobacco abuse   PKD (polycystic kidney disease)   Hypertensive disorder   Cannabis use disorder, moderate, dependence ([place])  Long Term Goal(s): Improvement in symptoms so as ready for discharge Improvement in symptoms so as ready for discharge   Short Term Goals: Ability to identify changes in lifestyle to reduce recurrence of condition will improve Ability to demonstrate self-control will improve Ability to identify triggers associated with substance abuse/mental health issues will improve Ability to identify and develop effective coping behaviors will improve     Medication Management: Evaluate patient's response, side effects, and tolerance of medication regimen.  Therapeutic Interventions: 1 to 1 sessions, Unit Group sessions and Medication administration.  Evaluation of Outcomes: Not Met   RN Treatment Plan for Primary Diagnosis: Depression Long Term Goal(s): Knowledge of disease and therapeutic regimen to maintain health will improve  Short Term Goals: Ability to identify and develop  effective coping behaviors will improve  Medication Management: RN will administer medications as ordered by provider, will assess and evaluate patient's response and provide education to patient for prescribed medication. RN will report any adverse and/or side effects to prescribing provider.  Therapeutic Interventions: 1 on 1 counseling sessions, Psychoeducation, Medication administration, Evaluate responses to treatment, Monitor vital signs and CBGs as ordered, Perform/monitor CIWA, COWS, AIMS and Fall Risk screenings as ordered, Perform wound care treatments as ordered.  Evaluation of Outcomes: Not Met   LCSW Treatment Plan for Primary Diagnosis: Depression Long Term Goal(s): Safe transition to appropriate next level of care at discharge, Engage patient in therapeutic group addressing interpersonal concerns.  Short Term Goals: Engage patient in aftercare planning with referrals and resources and Increase social support  Therapeutic Interventions: Assess for all discharge needs, 1 to 1 time with Social worker, Explore available resources and support systems, Assess for adequacy in community support network, Educate family and significant other(s) on suicide prevention, Complete Psychosocial Assessment, Interpersonal group therapy.  Evaluation of Outcomes: Not Met   Progress in Treatment: Attending groups: Yes. Participating in groups: Yes. Taking medication as prescribed: No. Toleration medication: No. Family/Significant other contact made: No, will contact:  friend Patient understands diagnosis: Yes. Discussing patient identified problems/goals with staff: Yes. Medical problems stabilized or resolved: Yes. Denies suicidal/homicidal ideation: Yes. Issues/concerns per patient self-inventory: No. Other: none  New problem(s) identified: No, Describe:  none  New Short Term/Long Term Goal(s): PT goal is to achieve a quick discharge.  Discharge Plan or Barriers: Pt will be referred  to outpatient services.  Reason for Continuation of Hospitalization: Depression  Estimated Length of Stay:3-5 days.  Attendees: Patient: Mitchell Mcdonald 04/29/2016   Physician: Dr. Jerilee Hoh, MD 04/29/2016   Nursing: Floyde Parkins, RN 04/29/2016   RN Care Manager: 04/29/2016   Social Worker: Lurline Idol, LCSW 04/29/2016   Recreational Therapist: Everitt Amber, LRT/CTRS  04/29/2016   Other:  04/29/2016   Other:  04/29/2016   Other: 04/29/2016     Scribe for Treatment Team: Joanne Chars, LCSW 04/29/2016 4:14 PM

## 2016-04-29 NOTE — H&P (Signed)
Psychiatric Admission Assessment Adult  Patient Identification: Mitchell Mcdonald MRN:  [id number] Date of Evaluation:  04/29/2016 Chief Complaint:  depression Principal Diagnosis: Depression Diagnosis:   Patient Active Problem List   Diagnosis Date Noted  . Unspecified depressive disorder [F32.9] 04/29/2016  . Cannabis use disorder, moderate, dependence (HCC) [F12.20] 04/29/2016  . Overdose on Tylenol [T39.1X1A] 04/27/2016  . Tobacco abuse [Z72.0] 04/27/2016  . PKD (polycystic kidney disease) [Q61.3] 12/23/2011  . Hypertensive disorder [I10] 01/07/2011   History of Present Illness:  25-year-old single Caucasian male from [place]. The patient was referred by [facility] for psychiatric hospitalization due to recent suicidal attempt by  Tylenol overdose, along with superficial cutting on his wrists.  Patient's grandmother contacted the police on 4/22. The grandmother reported that the patient was yelling at her and told her he was going to kill himself. The patient cut his wrist and then ran out of the house. The police found the patient 3 blocks away from the house and he refused to come with them and became combative.  After several hours in the ER the patient reported ingesting Tylenol unknown amount in an attempt to harm himself.  His Tylenol level was 138.  Patient reports having multiple stressors over the last couple of weeks to months ago one of his closest friend was killed. He recently broke up with his girlfriend of 2 years whom he has been living with. He was forced to move in with his grandmother 2 weeks ago. The patient had purchased a car that he had been paying and was only $200 away from paying it off. The car unfortunately was under his girlfriend's name and she refused to give it to him after they broke up. 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    Patient states that he does not get along well with his  grandmother and they have been frequently arguing. She says that she talks to him about the past constantly.  Patient has an extensive legal history. He was found guilty of 16 felonies at the age of 19. He was in prison for 2 years. Since coming out of prison he has been charged with driving without a license, a hit-and-run, speeding tickets and cannabis possession.  Patient missed a court date this past Monday ( the day after overdose) and has another court date pending coming up soon.  Today the patient denies having any symptoms consistent with major depressive disorder. He denies having major issues with his sleep, appetite, energy concentration or with his functioning. He said that he has been feeling sad for the last several weeks due to losing his friend and losing his job and his car. He says that breaking up with his girlfriend was actually a good thing as they were now getting along well.  The patient has now spoken with a friend and he plans to move in with him instead of going back to his grandmother's house. He says that his friend has a job lined up for him as well.  He denies having any suicidality, homicidality or auditory or visual hallucinations. He denies hopelessness or helplessness.  Patient denies any history of trauma  Substance abuse patient says that when he was charged with breaking and entering at the age of 19 he was also using cocaine and Xanax and marijuana. He said that he has not used any cocaine or Xanax since then. He does smoke marijuana about once   a week. He drinks about 5 drinks once a week as well. He smokes between a quarter to half a pack a day.   Associated Signs/Symptoms: Depression Symptoms:  suicidal attempt, (Hypo) Manic Symptoms:  Impulsivity, Anxiety Symptoms:  Excessive Worry, Psychotic Symptoms:  denies PTSD Symptoms: NA Total Time spent with patient: 1 hour  Past Psychiatric History: Patient denies having any prior psychiatric history. He  denies any psychiatric hospitalizations. He denies any history of suicidal attempts. He denies any history of self injury  Is the patient at risk to self? Yes.    Has the patient been a risk to self in the past 6 months? No.  Has the patient been a risk to self within the distant past? No.  Is the patient a risk to others? No.  Has the patient been a risk to others in the past 6 months? No.  Has the patient been a risk to others within the distant past? No.    Alcohol Screening: 1. How often do you have a drink containing alcohol?: 2 to 4 times a month 2. How many drinks containing alcohol do you have on a typical day when you are drinking?: 3 or 4 3. How often do you have six or more drinks on one occasion?: Never Preliminary Score: 1 4. How often during the last year have you found that you were not able to stop drinking once you had started?: Never 5. How often during the last year have you failed to do what was normally expected from you becasue of drinking?: Never 6. How often during the last year have you needed a first drink in the morning to get yourself going after a heavy drinking session?: Never 7. How often during the last year have you had a feeling of guilt of remorse after drinking?: Never 8. How often during the last year have you been unable to remember what happened the night before because you had been drinking?: Never 9. Have you or someone else been injured as a result of your drinking?: No 10. Has a relative or friend or a doctor or another health worker been concerned about your drinking or suggested you cut down?: No Alcohol Use Disorder Identification Test Final Score (AUDIT): 3 Brief Intervention: AUDIT score less than 7 or less-screening does not suggest unhealthy drinking-brief intervention not indicated  Past Medical History:  Past Medical History:  Diagnosis Date  . Depression   . HTN (hypertension)   . Polycystic kidney disease   . Polycystic kidney  disease   . Suicide attempt by drug ingestion ([place])   . Tobacco abuse    History reviewed. No pertinent surgical history.  Family History:  Family History  Problem Relation Age of Onset  . Family history unknown: Yes   Family Psychiatric  History: Patient's father committed suicide when the patient was less than 35 years old. He never met his mother but believes she was involved with drugs.  Tobacco Screening: Have you used any form of tobacco in the last 30 days? (Cigarettes, Smokeless Tobacco, Cigars, and/or Pipes): Yes Tobacco use, Select all that apply: 5 or more cigarettes per day Are you interested in Tobacco Cessation Medications?: No, patient refused Counseled patient on smoking cessation including recognizing danger situations, developing coping skills and basic information about quitting provided: Refused/Declined practical counseling   Social History: Patient was raised by his mother's adoptive mother, patient refers to her as his grandmother. Patient is single, never married, he does not have any  kids. As far as his education he completed a GED while in prison. 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 He was in prison for 2 years due to felony charges for breaking and entering. Currently he has at least 3 charges pending for him wrong, driving without a license, cannabis possession. History  Alcohol Use  . 1.2 - 1.8 oz/week  . 2 - 3 Cans of beer per week    Comment: occassionaly, last drink over a week ago      History  Drug Use No    Additional Social History: Marital status: Single Are you sexually active?: Yes What is your sexual orientation?: heterosexual Does patient have children?: No    History of alcohol / drug use?: No history of alcohol / drug abuse     Allergies:   Allergies  Allergen Reactions  . Ibuprofen Other (See Comments)    Kidney specialist advised him not to take it  . Nsaids     Kidney specialist advised him  not to take it    Lab Results:  Results for orders placed or performed during the hospital encounter of 04/28/16 (from the past 48 hour(s))  TSH     Status: None   Collection Time: 04/29/16  7:30 AM  Result Value Ref Range   TSH 1.753 0.350 - 4.500 uIU/mL    Comment: Performed by a 3rd Generation assay with a functional sensitivity of <=0.01 uIU/mL.    Blood Alcohol level:  Lab Results  Component Value Date   ETH <5 04/26/2016    Metabolic Disorder Labs:  No results found for: HGBA1C, MPG No results found for: PROLACTIN No results found for: CHOL, TRIG, HDL, CHOLHDL, VLDL, LDLCALC  Current Medications: Current Facility-Administered Medications  Medication Dose Route Frequency Provider Last Rate Last Dose  . alum & mag hydroxide-simeth (MAALOX/MYLANTA) 200-200-20 MG/5ML suspension 30 mL  30 mL Oral Q4H PRN Andrea Hernandez-Gonzalez, MD      . diphenhydrAMINE (BENADRYL) capsule 50 mg  50 mg Oral QHS PRN Andrea Hernandez-Gonzalez, MD      . [START ON 04/30/2016] enalapril (VASOTEC) tablet 5 mg  5 mg Oral Daily Andrea Hernandez-Gonzalez, MD      . magnesium hydroxide (MILK OF MAGNESIA) suspension 30 mL  30 mL Oral Daily PRN Andrea Hernandez-Gonzalez, MD      . nicotine (NICODERM CQ - dosed in mg/24 hours) patch 21 mg  21 mg Transdermal Daily Andrea Hernandez-Gonzalez, MD      . senna-docusate (Senokot-S) tablet 1 tablet  1 tablet Oral QHS PRN Andrea Hernandez-Gonzalez, MD       PTA Medications: Prescriptions Prior to Admission  Medication Sig Dispense Refill Last Dose  . amLODipine (NORVASC) 5 MG tablet Take 1 tablet (5 mg total) by mouth daily. 30 tablet 0   . nicotine (NICODERM CQ - DOSED IN MG/24 HOURS) 21 mg/24hr patch Place 1 patch (21 mg total) onto the skin daily. 28 patch 0     Musculoskeletal: Strength & Muscle Tone: within normal limits Gait & Station: normal Patient leans: N/A  Psychiatric Specialty Exam: Physical Exam  Constitutional: He is oriented to person,  place, and time. He appears well-developed and well-nourished.  HENT:  Head: Normocephalic and atraumatic.  Eyes: Conjunctivae and EOM are normal.  Neck: Normal range of motion.  Respiratory: Effort normal.  Musculoskeletal: Normal range of motion.  Neurological: He is alert and oriented to person, place, and time.    Review of   Systems  Constitutional: Negative.   HENT: Negative.   Eyes: Negative.   Respiratory: Negative.   Cardiovascular: Negative.   Gastrointestinal: Negative.   Genitourinary: Negative.   Musculoskeletal: Negative.   Skin: Negative.   Neurological: Negative.   Endo/Heme/Allergies: Negative.   Psychiatric/Behavioral: Positive for depression and substance abuse. Negative for hallucinations, memory loss and suicidal ideas. The patient is not nervous/anxious and does not have insomnia.     Blood pressure (!) 153/102, pulse 71, temperature 98.2 F (36.8 C), temperature source Oral, resp. rate 20, height 5' 11" (1.803 m), weight 85.7 kg (189 lb), SpO2 100 %.Body mass index is 26.36 kg/m.  General Appearance: Well Groomed  Eye Contact:  Good  Speech:  Clear and Coherent  Volume:  Normal  Mood:  Euthymic  Affect:  Appropriate and Congruent  Thought Process:  Linear and Descriptions of Associations: Intact  Orientation:  Full (Time, Place, and Person)  Thought Content:  Hallucinations: None  Suicidal Thoughts:  No  Homicidal Thoughts:  No  Memory:  Immediate;   Good Recent;   Good Remote;   Good  Judgement:  Fair  Insight:  Fair  Psychomotor Activity:  Normal  Concentration:  Concentration: Fair and Attention Span: Fair  Recall:  Good  Fund of Knowledge:  Good  Language:  Good  Akathisia:  No  Handed:    AIMS (if indicated):     Assets:  Communication Skills Physical Health  ADL's:  Intact  Cognition:  WNL  Sleep:  Number of Hours: 5.15    Treatment Plan Summary:  Patient is a 25-year-old single Caucasian male with no prior psychiatric history  other than substance abuse. He presented to [facility] emergency department last week after cutting his wrist superficially and overdosing on Tylenol.  Patient was hospitalized at [facility] where he received NAC.  Unspecified depressive disorder: Patient does not appear to meet criteria for major depressive disorder. Possible diagnoses are unspecified depressive disorder, adjustment disorder with depressed mood.  Patient is currently not interested in treatment with antidepressants. There is also no clear indication that he needs antidepressants at this point in time. We did recommended for the patient to engage in psychotherapy upon discharge.  Insomnia I will order Benadryl 50 mg by mouth daily at bedtime when necessary  Tylenol overdose: Last LFTs on 4/24 were within the normal limits. Platelets 209  Tobacco use disorder patient has orders for nicotine patch 21 mg a day but later today the patient said he was not interested in getting a nicotine patch  Cannabis use disorder: Patient will benefit from individual psychotherapy addressing substance abuse issues  Polycystic kidney disease: Creatinine is 0.68 BUN is 10  Hypertension patient has been restarted on enalapril 5 mg by mouth daily  Diet will be changed to low sodium  Precautions q 15 minutes  Vital signs daily  Hospitalization status involuntary commitment  Disposition: Will be discharged to his friend's house in [place] once stable  F/u will refer to [facility] upon discharge  Labs TSH wnl   Physician Treatment Plan for Primary Diagnosis: Depression Long Term Goal(s): Improvement in symptoms so as ready for discharge  Short Term Goals: Ability to identify changes in lifestyle to reduce recurrence of condition will improve, Ability to demonstrate self-control will improve and Ability to identify triggers associated with substance abuse/mental health issues will improve  Physician Treatment Plan for Secondary  Diagnosis: Principal Problem:   Unspecified depressive disorder Active Problems:   Overdose on Tylenol     Tobacco abuse   PKD (polycystic kidney disease)   Hypertensive disorder   Cannabis use disorder, moderate, dependence (HCC)  Long Term Goal(s): Improvement in symptoms so as ready for discharge  Short Term Goals: Ability to identify and develop effective coping behaviors will improve  I certify that inpatient services furnished can reasonably be expected to improve the patient's condition.    Hernandez-Gonzalez,  Andrea, MD 4/25/20181:33 PM 

## 2016-04-29 NOTE — B[facility] Group Notes (Signed)
[facility] LCSW Group Therapy   04/29/2016  9:30 am   Type of Therapy: Group Therapy   Participation Level: Active   Participation Quality: Attentive, Sharing and Supportive   Affect: Appropriate   Cognitive: Alert and Oriented   Insight: Developing/Improving and Engaged   Engagement in Therapy: Developing/Improving and Engaged   Modes of Intervention: Clarification, Confrontation, Discussion, Education, Exploration, Limit-setting, Orientation, Problem-solving, Rapport Building, Dance movement psychotherapist, Socialization and Support   Summary of Progress/Problems: The topic for group today was emotional regulation. This group focused on both positive and negative emotion identification and allowed  group members to process ways to identify feelings, regulate negative emotions, and find healthy ways to manage internal/external emotions. Group members were asked to reflect on a time when their reaction to an emotion led to a negative outcome and explored how alternative responses using emotion regulation would have benefited them. Group members were also asked to discuss a time when emotion regulation was utilized when a negative emotion was experienced.     Hampton Abbot, MSW, LCSWA 04/29/2016, 11:51 AM

## 2016-04-29 NOTE — [facility] Group Notes (Signed)
[facility] Group Notes:  (Nursing/MHT/Case Management/Adjunct)  Date:  04/29/2016  Time:  1:06 AM  Type of Therapy:  Evening Wrap-up Group  Participation Level:  Did Not Attend  Participation Quality:  N/A  Affect:  N/A  Cognitive:  N/A  Insight:  None  Engagement in Group:  Did Not Attend  Modes of Intervention:  Discussion  Summary of Progress/Problems:  Mitchell Mcdonald 04/29/2016, 1:06 AM

## 2016-04-29 NOTE — Progress Notes (Signed)
Recreation Therapy Notes  INPATIENT RECREATION THERAPY ASSESSMENT  Patient Details Name: Mitchell Mcdonald MRN: [id number] DOB: 07/18/1990 Today's Date: 04/29/2016  Patient Stressors: Family, Relationship, Death, Work  Pharmacologist:   Exercise, Music, Sports, Substance Abuse  Personal Challenges: Trusting Others  Leisure Interests (2+):  Music - Listen, Individual - Other (Comment) (Play ball)  Awareness of Community Resources:  Yes  Community Resources:  Park, Cytogeneticist (Comment) [employer] court)  Current Use: Yes  If no, Barriers?:    Patient Strengths:  Determined  Patient Identified Areas of Improvement:  No  Current Recreation Participation:  Playing ball, working out, listening to music  Patient Goal for Hospitalization:  Ride this out till he gets home  [place] of Residence:  [place] of Residence:  [place]   Current [place] (including self-harm):  No  Current HI:  No  Consent to Intern Participation: N/A   Jacquelynn Cree, LRT/CTRS 04/29/2016, 3:50 PM

## 2016-04-29 NOTE — Progress Notes (Signed)
Chaplain meet patient when he was outside playing basketball with a friend. Patient said that he did not make a request to see a Chaplain and he carried on with his basketball practice.   04/29/16 1500  Clinical Encounter Type  Visited With Patient  Visit Type Initial  Referral From Nurse  Consult/Referral To Chaplain  Spiritual Encounters  Spiritual Needs Other (Comment)

## 2016-04-29 NOTE — Progress Notes (Signed)
Recreation Therapy Notes  Date: 04.25.18 Time: 1:00 pm Location: Craft Room  Group Topic: Self-esteem  Goal Area(s) Addresses:  Patient will write at least one positive trait about self. Patient will verbalize benefit of having a healthy self-esteem.  Behavioral Response: Did not attend  Intervention: I Am  Activity: Patients were given a worksheet with the letter I on it and were instructed to write as many positive traits about themselves inside the letter.   Education: LRT educated patients on ways they can increase their self-esteem.  Education Outcome: Patient did not attend group.  Clinical Observations/Feedback: Patient did not attend group.  Jacquelynn Cree, LRT/CTRS 04/29/2016 3:31 PM

## 2016-04-29 NOTE — [facility] Suicide Risk Assessment (Signed)
[facility] Admission Suicide Risk Assessment   Nursing information obtained from:  Patient Demographic factors:  Male, Caucasian, Adolescent or young adult, Unemployed Current Mental Status:  Suicidal ideation indicated by patient, Self-harm behaviors Loss Factors:  Decrease in vocational status, Loss of significant relationship, Legal issues, Financial problems / change in socioeconomic status Historical Factors:  NA Risk Reduction Factors:  Sense of responsibility to family   Principal Problem: Depression Diagnosis:   Patient Active Problem List   Diagnosis Date Noted  . Unspecified depressive disorder [F32.9] 04/29/2016  . Cannabis use disorder, moderate, dependence (HCC) [F12.20] 04/29/2016  . Overdose on Tylenol [T39.1X1A] 04/27/2016  . Tobacco abuse [Z72.0] 04/27/2016  . PKD (polycystic kidney disease) [Q61.3] 12/23/2011  . Hypertensive disorder [I10] 01/07/2011   Subjective Data:   Continued Clinical Symptoms:  Alcohol Use Disorder Identification Test Final Score (AUDIT): 3 The "Alcohol Use Disorders Identification Test", Guidelines for Use in Primary Care, Second Edition.  World Science writer [facility]). Score between 0-7:  no or low risk or alcohol related problems. Score between 8-15:  moderate risk of alcohol related problems. Score between 16-19:  high risk of alcohol related problems. Score 20 or above:  warrants further diagnostic evaluation for alcohol dependence and treatment.   CLINICAL FACTORS:   Severe Anxiety and/or Agitation Depression:   Impulsivity Alcohol/Substance Abuse/Dependencies    Psychiatric Specialty Exam: Physical Exam  ROS  Blood pressure (!) 153/102, pulse 71, temperature 98.2 F (36.8 C), temperature source Oral, resp. rate 20, height  (1.803 m), weight 85.7 kg (189 lb), SpO2 100 %.Body mass index is 26.36 kg/m.                                                    Sleep:  Number of Hours: 5.15      COGNITIVE  FEATURES THAT CONTRIBUTE TO RISK:  Closed-mindedness    SUICIDE RISK:   Mild:  Suicidal ideation of limited frequency, intensity, duration, and specificity.  There are no identifiable plans, no associated intent, mild dysphoria and related symptoms, good self-control (both objective and subjective assessment), few other risk factors, and identifiable protective factors, including available and accessible social support.  PLAN OF CARE: admit to [facility]  I certify that inpatient services furnished can reasonably be expected to improve the patient's condition.   Jimmy Footman, MD 04/29/2016, 1:32 PM

## 2016-04-29 NOTE — Plan of Care (Signed)
Problem: Activity: Goal: Sleeping patterns will improve Outcome: Progressing Patient slept for Estimated Hours of 5.15; every 15 minutes safety round maintained, no injury or falls during this shift.    

## 2016-04-29 NOTE — Progress Notes (Signed)
Patient ID: Mitchell Mcdonald, male   DOB: [birth year], 26 y.o.   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 Nursing staffs will continue to engage, encourage expression of feelings and provide clinical support.

## 2016-04-30 NOTE — B[facility] Group Notes (Signed)
Goals Group Date/Time: 04/30/2016 9:00 AM Type of Therapy and Topic: Group Therapy: Goals Group: SMART Goals   Participation Level: Moderate  Description of Group:    The purpose of a daily goals group is to assist and guide patients in setting recovery/wellness-related goals. The objective is to set goals as they relate to the crisis in which they were admitted. Patients will be using SMART goal modalities to set measurable goals. Characteristics of realistic goals will be discussed and patients will be assisted in setting and processing how one will reach their goal. Facilitator will also assist patients in applying interventions and coping skills learned in psycho-education groups to the SMART goal and process how one will achieve defined goal.   Therapeutic Goals:   -Patients will develop and document one goal related to or their crisis in which brought them into treatment.  -Patients will be guided by LCSW using SMART goal setting modality in how to set a measurable, attainable, realistic and time sensitive goal.  -Patients will process barriers in reaching goal.  -Patients will process interventions in how to overcome and successful in reaching goal.   Patient's Goal: Pt goal was to do a work out in his room today (lunges and dips) and also to attend the groups.   Therapeutic Modalities:  Motivational Interviewing  Research officer, political party  SMART goals setting   Daleen Squibb, [place]

## 2016-04-30 NOTE — BHH Suicide Risk Assessment (Addendum)
[facility] INPATIENT:  Family/Significant Other Suicide Prevention Education  Suicide Prevention Education:  Contact Attempts: Winfred Leeds, friend, [phone], has been identified by the patient as the family member/significant other with whom the patient will be residing, and identified as the person(s) who will aid the patient in the event of a mental health crisis.  With written consent from the patient, two attempts were made to provide suicide prevention education, prior to and/or following the patient's discharge.  We were unsuccessful in providing suicide prevention education.  A suicide education pamphlet was given to the patient to share with family/significant other.  Date and time of first attempt:04/30/16, 1540 CSW received call back with request for CSW to call between 1330 and 1430.  CSW made second attempt within that time but was still unable to reach her. Date and time of second attempt: 05/01/16, 1340  Lorri Frederick, LCSW 04/30/2016, 3:41 PM

## 2016-04-30 NOTE — Progress Notes (Signed)
Patient ID: Mitchell Mcdonald, male   DOB: 1990/07/10, 26 y.o.   MRN: [id number] Mitchell Mcdonald, his GF visited; patient appeared to be very happy; well behaved, no behavioral problem with others; denied SI/SIB/HI/AVH.

## 2016-04-30 NOTE — Plan of Care (Signed)
Problem: [facility] Participation in Recreation Therapeutic Interventions Goal: STG-Patient will identify at least five coping skills for ** STG: Coping Skills - Within 4 treatment sessions, patient will verbalize at least 5 new coping skills in each of 2 treatment sessions to increase healthy coping skills.  Outcome: Progressing Treatment Session 1; Completed 1 out of 2: At approximately 2:00 pm, LRT met with patient in craft room. Patient verbalized 12 coping skills. LRT encouraged patient to continue thinking of healthy coping skills.  Leonette Monarch, LRT/CTRS 04.26.18 3:33 pm

## 2016-04-30 NOTE — Plan of Care (Signed)
Problem: Coping: Goal: Ability to cope will improve Outcome: Not Progressing Working on coping skills    

## 2016-04-30 NOTE — [facility] Group Notes (Signed)
[facility] LCSW Group Therapy Note  Date/Time: 04/30/16, 0930  Type of Therapy/Topic:  Group Therapy:  Balance in Life  Participation Level:  active  Description of Group:    This group will address the concept of balance and how it feels and looks when one is unbalanced. Patients will be encouraged to process areas in their lives that are out of balance, and identify reasons for remaining unbalanced. Facilitators will guide patients utilizing problem- solving interventions to address and correct the stressor making their life unbalanced. Understanding and applying boundaries will be explored and addressed for obtaining  and maintaining a balanced life. Patients will be encouraged to explore ways to assertively make their unbalanced needs known to significant others in their lives, using other group members and facilitator for support and feedback.  Therapeutic Goals: 1. Patient will identify two or more emotions or situations they have that consume much of in their lives. 2. Patient will identify signs/triggers that life has become out of balance:  3. Patient will identify two ways to set boundaries in order to achieve balance in their lives:  4. Patient will demonstrate ability to communicate their needs through discussion and/or role plays  Summary of Patient Progress: Pt identified family, personal relationships, and mental/emotional as areas of his life that are out of balance right now.  Pt shared with the group his situation with both his grandmother and his girlfriend and what he is trying to do to rebalance those relationships.          Therapeutic Modalities:   Cognitive Behavioral Therapy Solution-Focused Therapy Assertiveness Training  Daleen Squibb, [place]

## 2016-04-30 NOTE — Plan of Care (Addendum)
Problem: Activity: Goal: Sleeping patterns will improve Outcome: Progressing Patient slept for Estimated Hours of 7.45; every 15 minutes safety round maintained, no injury or falls during this shift.    

## 2016-04-30 NOTE — Progress Notes (Signed)
D: Patient stated slept good last night .Stated appetite is good and energy level  Is normal. Stated concentration is good . Stated on Depression scale , hopeless and anxiety .( low 0-10 high) Denies suicidal  homicidal ideations  .  No auditory hallucinations  No pain concerns . Appropriate ADL'S. Interacting with peers and staff. Voice of working on coping skills  And exercising during shift  A: Encourage patient participation with unit programming . Instruction  Given on  Medication , verbalize understanding. R: Voice no other concerns. Staff continue to monitor

## 2016-04-30 NOTE — Progress Notes (Signed)
Recreation Therapy Notes  Date: 04.26.18 Time: 1:00 pm Location: Craft Room  Group Topic: Leisure Education  Goal Area(s) Addresses:  Patient will identify activities for each letter of the alphabet. Patient will verbalize ability to integrate positive leisure into life post d/c. Patient will verbalize ability to use leisure as a Associate Professor.  Behavioral Response: Attentive, Interactive  Intervention: Leisure Alphabet  Activity: Patients were given a Leisure Information systems manager and were instructed to write healthy leisure activities for each letter of the alphabet.   Education: LRT educated patients on what they need to participate in leisure.  Education Outcome: Acknowledges education/In group clarification offered  Clinical Observations/Feedback: Patient wrote healthy leisure activities. Patient contributed to group discussion by stating some of his healthy leisure activities, what he needs to participate in leisure, and what makes leisure a good coping skill.  Jacquelynn Cree, LRT/CTRS 04/30/2016 1:53 PM

## 2016-04-30 NOTE — Progress Notes (Signed)
[facility] MD Progress Note  04/30/2016 11:59 AM Mitchell Mcdonald  MRN:  [id number] Subjective: 26 year old single Caucasian male from [place]. The patient was referred by [facility] for psychiatric hospitalization due to recent suicidal attempt by  Tylenol overdose, along with superficial cutting on his wrists.  Patient's grandmother contacted the police on 3/08. The grandmother reported that the patient was yelling at her and told her he was going to kill himself. The patient cut his wrist and then ran out of the house. The police found the patient 3 blocks away from the house and he refused to come with them and became combative.  After several hours in the ER the patient reported ingesting Tylenol unknown amount in an attempt to harm himself.  His Tylenol level was 138.  Patient reports having multiple stressors over the last couple of weeks to months ago one of his closest friend was killed. He recently broke up with his girlfriend of 2 years whom he has been living with. He was forced to move in with his grandmother 2 weeks ago. The patient had purchased a car that he had been paying and was only $200 away from paying it off. The car unfortunately was under his girlfriend's name and she refused to give it to him after they broke up. 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    Patient states that he does not get along well with his grandmother and they have been frequently arguing. She says that she talks to him about the past constantly.  Patient has an extensive legal history. He was found guilty of 73 felonies at the age of 29. He was in prison for 2 years. Since coming out of prison he has been charged with driving without a license, a hit-and-run, speeding tickets and cannabis possession.  Patient missed a court date this past Monday ( the day after overdose) and has another court date pending coming up soon.   4/26  Pt says he  feel fine today. Says he has lost interest in activities he liked, however he denies feeling sad, suicidality, homocidality or hallucinations today.  Per Nurses note:Pt pleasant and cooperative. Pt denies SI, HI, AVH. Pt reports wanting to get out of here. Reports this is not the place for him he would rather [place]" rather than in here. Pt noted smiling and talking with peers in no distress.  Encouragement and support offered. Safety checks maintained.  Pt remains safe on unit with q 15 min checks.  Principal Problem: Depression Diagnosis:   Patient Active Problem List   Diagnosis Date Noted  . Unspecified depressive disorder [F32.9] 04/29/2016  . Cannabis use disorder, moderate, dependence ([place]) [F12.20] 04/29/2016  . Overdose on Tylenol [T39.1X1A] 04/27/2016  . Tobacco abuse [Z72.0] 04/27/2016  . PKD (polycystic kidney disease) [Q61.3] 12/23/2011  . Hypertensive disorder [I10] 01/07/2011   Total Time spent with patient: 30 minutes  Past Psychiatric History:Patient denies having any prior psychiatric history. He denies any psychiatric hospitalizations. He denies any history of suicidal attempts. He denies any history of self injury   Past Medical History:  Past Medical History:  Diagnosis Date  . Depression   . HTN (hypertension)   . Polycystic kidney disease   . Polycystic kidney disease   . Suicide attempt by drug ingestion ([place])   . Tobacco abuse    History reviewed. No pertinent surgical history. Family History:  Family History  Problem Relation Age of Onset  . Family history unknown: Yes   Family Psychiatric  History:Patient's father committed suicide when the patient was less than 28 years old. He never met his mother but believes she was involved with drugs Social History:Patient was raised by his mother's adoptive mother, patient refers to her as his grandmother. Patient is single, never married, he does not have any kids. As far as his education he  completed a GED while in prison. Up until recently he was working as a Training and development officer at [employer] in [place]. He was in prison for 2 years due to felony charges for breaking and entering. Currently he has at least 3 charges pending for him wrong, driving without a license, cannabis possession.  History  Alcohol Use  . 1.2 - 1.8 oz/week  . 2 - 3 Cans of beer per week    Comment: occassionaly, last drink over a week ago      History  Drug Use No    Social History   Social History  . Marital status: Single    Spouse name: N/A  . Number of children: N/A  . Years of education: N/A   Social History Main Topics  . Smoking status: Current Some Day Smoker    Packs/day: 0.50    Types: Cigarettes  . Smokeless tobacco: Never Used  . Alcohol use 1.2 - 1.8 oz/week    2 - 3 Cans of beer per week     Comment: occassionaly, last drink over a week ago   . Drug use: No  . Sexual activity: No   Other Topics Concern  . None   Social History Narrative  . None   Additional Social History:    History of alcohol / drug use?: No history of alcohol / drug abuse     Current Medications: Current Facility-Administered Medications  Medication Dose Route Frequency Provider Last Rate Last Dose  . alum & mag hydroxide-simeth (MAALOX/MYLANTA) 200-200-20 MG/5ML suspension 30 mL  30 mL Oral Q4H PRN Hildred Priest, MD      . diphenhydrAMINE (BENADRYL) capsule 50 mg  50 mg Oral QHS PRN Hildred Priest, MD      . enalapril (VASOTEC) tablet 5 mg  5 mg Oral Daily Hildred Priest, MD   5 mg at 04/30/16 0835  . magnesium hydroxide (MILK OF MAGNESIA) suspension 30 mL  30 mL Oral Daily PRN Hildred Priest, MD      . nicotine (NICODERM CQ - dosed in mg/24 hours) patch 21 mg  21 mg Transdermal Daily Hildred Priest, MD      . senna-docusate (Senokot-S) tablet 1 tablet  1 tablet Oral QHS PRN Hildred Priest, MD        Lab Results:  Results for orders  placed or performed during the hospital encounter of 04/28/16 (from the past 48 hour(s))  TSH     Status: None   Collection Time: 04/29/16  7:30 AM  Result Value Ref Range   TSH 1.753 0.350 - 4.500 uIU/mL    Comment: Performed by a 3rd Generation assay with a functional sensitivity of <=0.01 uIU/mL.    Blood Alcohol level:  Lab Results  Component Value Date   ETH <5 17/51/0258    Metabolic Disorder Labs: No results found for: HGBA1C, MPG No results found for: PROLACTIN No results found for: CHOL, TRIG, HDL, CHOLHDL, VLDL, LDLCALC  Physical Findings: AIMS: Facial and Oral Movements Muscles of Facial Expression: None, normal Lips and Perioral Area: None, normal Jaw:  None, normal Tongue: None, normal,Extremity Movements Upper (arms, wrists, hands, fingers): None, normal Lower (legs, knees, ankles, toes): None, normal, Trunk Movements Neck, shoulders, hips: None, normal, Overall Severity Severity of abnormal movements (highest score from questions above): None, normal Incapacitation due to abnormal movements: None, normal Patient's awareness of abnormal movements (rate only patient's report): No Awareness, Dental Status Current problems with teeth and/or dentures?: No Does patient usually wear dentures?: No  CIWA:    COWS:     Musculoskeletal: Strength & Muscle Tone: within normal limits Gait & Station: normal Patient leans: N/A  Psychiatric Specialty Exam: Physical Exam  Constitutional: He is oriented to person, place, and time. He appears well-developed and well-nourished.  HENT:  Head: Atraumatic.  Eyes: EOM are normal.  Respiratory: Effort normal.  Musculoskeletal: Normal range of motion.  Neurological: He is alert and oriented to person, place, and time.    Review of Systems  Constitutional: Negative.   HENT: Negative.   Eyes: Negative.   Respiratory: Negative.   Gastrointestinal: Negative.   Genitourinary: Negative.   Musculoskeletal: Negative.   Skin:  Negative.   Neurological: Negative.   Endo/Heme/Allergies: Negative.   Psychiatric/Behavioral: Negative for depression, hallucinations, memory loss, substance abuse and suicidal ideas. The patient is not nervous/anxious and does not have insomnia.     Blood pressure 131/85, pulse 70, temperature 97.8 F (36.6 C), temperature source Oral, resp. rate 20, height _0  (1.803 m), weight 85.7 kg (189 lb), SpO2 100 %.Body mass index is 26.36 kg/m.  General Appearance: Well Groomed  Eye Contact:  Good  Speech:  Clear and Coherent  Volume:  Normal  Mood:  Euthymic  Affect:  Appropriate and Congruent  Thought Process:  Linear and Descriptions of Associations: Intact  Orientation:  Full (Time, Place, and Person)  Thought Content:  Logical and Hallucinations: None  Suicidal Thoughts:  No  Homicidal Thoughts:  No  Memory:  Immediate;   Good Recent;   Good Remote;   Good  Judgement:  Fair  Insight:  Shallow  Psychomotor Activity:  Normal  Concentration:  Concentration: Good and Attention Span: Good  Recall:  Good  Fund of Knowledge:  Good  Language:  Good  Akathisia:  No    AIMS (if indicated):     Assets:  Armed forces logistics/support/administrative officer Physical Health Social Support Vocational/Educational  ADL's:  Intact  Cognition:  WNL  Sleep:  Number of Hours: 7.45     Treatment Plan Summary: Patient is a 26 year old single Caucasian male with no prior psychiatric history other than substance abuse. He presented to [facility] emergency department last week after cutting his wrist superficially and overdosing on Tylenol.  Patient was hospitalized at [facility] where he received NAC.  Unspecified depressive disorder: Patient does not appear to meet criteria for major depressive disorder. Possible diagnoses are unspecified depressive disorder, adjustment disorder with depressed mood.  Patient is currently not interested in treatment with antidepressants. There is also no clear indication that he  needs antidepressants at this point in time. We did recommended for the patient to engage in psychotherapy upon discharge.  Insomnia continue Benadryl 50 mg by mouth daily at bedtime when necessary  Tylenol overdose: Last LFTs on 4/24 were within the normal limits. Platelets 209  Tobacco use disorder patient  Should continue nicotine patch 21 mg a day   Cannabis use disorder: Patient will benefit from individual psychotherapy addressing substance abuse issues. Pt declined   Polycystic kidney disease: Creatinine is 0.68 BUN is 10  Hypertension continue enalapril 5 mg by mouth daily  Diet  low sodium  Precautions q 15 minutes  Vital signs daily  Hospitalization status involuntary commitment  Disposition: Will be discharged to his friend's house in [place] once stable  F/u will refer to [facility] upon discharge  Labs TSH wnl  Due to serious overdose he will be here for 3-5 days  Hildred Priest, MD 04/30/2016, 11:59 AM

## 2016-05-01 MED ORDER — LISINOPRIL 5 MG PO TABS: 5.0000 mg | ORAL_TABLET | Freq: Every day | ORAL | 0 refills | Status: DC

## 2016-05-01 MED ORDER — LISINOPRIL 5 MG PO TABS: 5.0000 mg | ORAL_TABLET | Freq: Every day | ORAL | Status: DC

## 2016-05-01 NOTE — [facility] Group Notes (Signed)
[facility] LCSW Group Therapy Note  Date/Time: 05/01/16, 0930  Type of Therapy and Topic:  Group Therapy:  Feelings around Relapse and Recovery  Participation Level:  Active   Mood:  Description of Group:    Patients in this group will discuss emotions they experience before and after a relapse. They will process how experiencing these feelings, or avoidance of experiencing them, relates to having a relapse. Facilitator will guide patients to explore emotions they have related to recovery. Patients will be encouraged to process which emotions are more powerful. They will be guided to discuss the emotional reaction significant others in their lives may have to patients' relapse or recovery. Patients will be assisted in exploring ways to respond to the emotions of others without this contributing to a relapse.  Therapeutic Goals: 1. Patient will identify two or more emotions that lead to relapse for them:  2. Patient will identify two emotions that result when they relapse:  3. Patient will identify two emotions related to recovery:  4. Patient will demonstrate ability to communicate their needs through discussion and/or role plays.   Summary of Patient Progress:Summary of Patient Progress:Pt shared that boredom and irritation for him can be  emotions that leads to relapse.  He had a number of options to handle boredom such as working out and taking care of his dog.Peri Jefferson participation overall.      Therapeutic Modalities:   Cognitive Behavioral Therapy Solution-Focused Therapy Assertiveness Training Relapse Prevention Therapy  Daleen Squibb, LCSW

## 2016-05-01 NOTE — Progress Notes (Signed)
Patient is pleasant & cooperative in the unit.Denies suicidal or homicidal ideations and AV hallucinations.Appropriate with staff & peers.Compliant with medications.Attended groups.Appetite & energy level good.Support & encouragement given. 

## 2016-05-01 NOTE — [facility] Group Notes (Signed)
[facility] Group Notes:  (Nursing/MHT/Case Management/Adjunct)  Date:  05/01/2016  Time:  2:04 AM  Type of Therapy:  Group Therapy  Participation Level:  Active  Participation Quality:  Appropriate  Affect:  Appropriate  Cognitive:  Alert  Insight:  Good  Engagement in Group:  Engaged  Modes of Intervention:  Activity  Summary of Progress/Problems:  Mitchell Mcdonald 05/01/2016, 2:04 AM

## 2016-05-01 NOTE — [facility] Group Notes (Signed)
[facility] LCSW Group Therapy  05/01/2016 3:42 PM  Type of Therapy:  Group Therapy  Participation Level:  Patient did not attend group. CSW invited patient to group.   Summary of Progress/Problems: Stress management: Patients defined and discussed the topic of stress and the related symptoms and triggers for stress. Patients identified healthy coping skills they would like to try during hospitalization and after discharge to manage stress in a healthy way. CSW offered insight to varying stress management techniques.   Antoinette Borgwardt G. Garnette Czech MSW, LCSWA 05/01/2016, 3:43 PM

## 2016-05-01 NOTE — Care Management (Addendum)
RNCM consult received for PCP appointment. I contacted listed PCP Dr. Tally Joe and patient has an unpaid balance with Dr. Merita Norton office since last year causing office to discontinue their services with patient. Patient is listed as self-pay. I have contacted [facility] [phone] and that office is closed until Monday. I have placed this contact number on patient's discharge follow up. Note placed on TEAM sticky note in the event this RNCM is not available and to make appointment for patient to follow up.

## 2016-05-01 NOTE — Progress Notes (Signed)
  [facility] Adult Case Management Discharge Plan :  Will you be returning to the same living situation after discharge:  No. will live with friends. At discharge, do you have transportation home?: Yes,  with friend Do you have the ability to pay for your medications: No.   Release of information consent forms completed and in the chart;  Patient's signature needed at discharge.  Patient to Follow up at: Follow-up Information    [facility]. Call.   Contact information: [address] [place] [address] [phone]       [facility]. Go in 7 day(s).   Specialty:  Behavioral Health Why:  Please attend your hospital follow up appointment to arrange therapy at [facility] during walk in hours, 8am-3pm, Monday through Friday. Contact information: [address] [place] [address] [phone]           Next level of care provider has access to [facility] Link:no  Safety Planning and Suicide Prevention discussed: No. Attempts made.  Have you used any form of tobacco in the last 30 days? (Cigarettes, Smokeless Tobacco, Cigars, and/or Pipes): Yes  Has patient been referred to the Quitline?: Patient refused referral  Patient has been referred for addiction treatment: Yes  Lorri Frederick, LCSW 05/01/2016, 3:47 PM

## 2016-05-01 NOTE — Progress Notes (Signed)
[facility] MD Progress Note  05/01/2016 12:34 PM Mitchell Mcdonald  MRN:  [id number] Subjective: 26 year old single Caucasian male from [place]. The patient was referred by [facility] for psychiatric hospitalization due to recent suicidal attempt by  Tylenol overdose, along with superficial cutting on his wrists.  Patient's grandmother contacted the police on 9/57. The grandmother reported that the patient was yelling at her and told her he was going to kill himself. The patient cut his wrist and then ran out of the house. The police found the patient 3 blocks away from the house and he refused to come with them and became combative.  After several hours in the ER the patient reported ingesting Tylenol unknown amount in an attempt to harm himself.  His Tylenol level was 138.  Patient reports having multiple stressors over the last couple of weeks to months ago one of his closest friend was killed. He recently broke up with his girlfriend of 2 years whom he has been living with. He was forced to move in with his grandmother 2 weeks ago. The patient had purchased a car that he had been paying and was only $200 away from paying it off. The car unfortunately was under his girlfriend's name and she refused to give it to him after they broke up. 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    Patient states that he does not get along well with his grandmother and they have been frequently arguing. She says that she talks to him about the past constantly.  Patient has an extensive legal history. He was found guilty of 16 felonies at the age of 79. He was in prison for 2 years. Since coming out of prison he has been charged with driving without a license, a hit-and-run, speeding tickets and cannabis possession.  Patient missed a court date this past Monday ( the day after overdose) and has another court date pending coming up soon.   4/26  Pt says he  feel fine today. Says he has lost interest in activities he liked, however he denies feeling sad, suicidality, homocidality or hallucinations today.  4/27 Patient appears well today. He has been participating in group activities, but doesn't get much out of them because they are "repetitive." He states he will be living with his friends Linna Hoff and Sharyn Lull in [place] when he leaves. They will be picking him up when he's discharged this weekend. Patient denies suicidality, homicidality, or auditory or visual hallucinations.   Per nurse: Patient stated slept good last night .Stated appetite is good and energy level  Is normal. Stated concentration is good . Stated on Depression scale , hopeless and anxiety .( low 0-10 high) Denies suicidal  homicidal ideations  No auditory hallucinations  No pain concerns . Appropriate ADL'S. Interacting with peers and staff. Voice of working on coping skills  And exercising during shift. Encourage patient participation with unit programming . Instruction  Given on  Medication , verbalize understanding. Voice no other concerns. Staff continue to monitor   Principal Problem: Depression Diagnosis:   Patient Active Problem List   Diagnosis Date Noted  . Unspecified depressive disorder [F32.9] 04/29/2016  . Cannabis use disorder, moderate, dependence ([place]) [F12.20] 04/29/2016  . Overdose on Tylenol [T39.1X1A] 04/27/2016  . Tobacco abuse [Z72.0] 04/27/2016  . PKD (polycystic kidney disease) [Q61.3] 12/23/2011  . Hypertensive disorder [I10] 01/07/2011   Total Time spent with patient: 30 minutes  Past  Psychiatric History:Patient denies having any prior psychiatric history. He denies any psychiatric hospitalizations. He denies any history of suicidal attempts. He denies any history of self injury   Past Medical History:  Past Medical History:  Diagnosis Date  . Depression   . HTN (hypertension)   . Polycystic kidney disease   . Polycystic kidney disease   .  Suicide attempt by drug ingestion ([place])   . Tobacco abuse    History reviewed. No pertinent surgical history. Family History:  Family History  Problem Relation Age of Onset  . Family history unknown: Yes   Family Psychiatric  History:Patient's father committed suicide when the patient was less than 38 years old. He never met his mother but believes she was involved with drugs  Social History:Patient was raised by his mother's adoptive mother, patient refers to her as his grandmother. Patient is single, never married, he does not have any kids. As far as his education he completed a GED while in prison. Up until recently he was working as a Training and development officer at [employer] in [place]. He was in prison for 2 years due to felony charges for breaking and entering. Currently he has at least 3 charges pending for him wrong, driving without a license, cannabis possession.  History  Alcohol Use  . 1.2 - 1.8 oz/week  . 2 - 3 Cans of beer per week    Comment: occassionaly, last drink over a week ago      History  Drug Use No    Social History   Social History  . Marital status: Single    Spouse name: N/A  . Number of children: N/A  . Years of education: N/A   Social History Main Topics  . Smoking status: Current Some Day Smoker    Packs/day: 0.50    Types: Cigarettes  . Smokeless tobacco: Never Used  . Alcohol use 1.2 - 1.8 oz/week    2 - 3 Cans of beer per week     Comment: occassionaly, last drink over a week ago   . Drug use: No  . Sexual activity: No   Other Topics Concern  . None   Social History Narrative  . None   Additional Social History:    History of alcohol / drug use?: No history of alcohol / drug abuse     Current Medications: Current Facility-Administered Medications  Medication Dose Route Frequency Provider Last Rate Last Dose  . alum & mag hydroxide-simeth (MAALOX/MYLANTA) 200-200-20 MG/5ML suspension 30 mL  30 mL Oral Q4H PRN Hildred Priest, MD      .  diphenhydrAMINE (BENADRYL) capsule 50 mg  50 mg Oral QHS PRN Hildred Priest, MD      . Derrill Memo ON 05/02/2016] lisinopril (PRINIVIL,ZESTRIL) tablet 5 mg  5 mg Oral Daily Hildred Priest, MD      . magnesium hydroxide (MILK OF MAGNESIA) suspension 30 mL  30 mL Oral Daily PRN Hildred Priest, MD      . nicotine (NICODERM CQ - dosed in mg/24 hours) patch 21 mg  21 mg Transdermal Daily Hildred Priest, MD      . senna-docusate (Senokot-S) tablet 1 tablet  1 tablet Oral QHS PRN Hildred Priest, MD        Lab Results:  No results found for this or any previous visit (from the past 48 hour(s)).  Blood Alcohol level:  Lab Results  Component Value Date   ETH <5 29/79/8921    Metabolic Disorder Labs: No results found for:  HGBA1C, MPG No results found for: PROLACTIN No results found for: CHOL, TRIG, HDL, CHOLHDL, VLDL, LDLCALC  Physical Findings: AIMS: Facial and Oral Movements Muscles of Facial Expression: None, normal Lips and Perioral Area: None, normal Jaw: None, normal Tongue: None, normal,Extremity Movements Upper (arms, wrists, hands, fingers): None, normal Lower (legs, knees, ankles, toes): None, normal, Trunk Movements Neck, shoulders, hips: None, normal, Overall Severity Severity of abnormal movements (highest score from questions above): None, normal Incapacitation due to abnormal movements: None, normal Patient's awareness of abnormal movements (rate only patient's report): No Awareness, Dental Status Current problems with teeth and/or dentures?: No Does patient usually wear dentures?: No  CIWA:    COWS:     Musculoskeletal: Strength & Muscle Tone: within normal limits Gait & Station: normal Patient leans: N/A  Psychiatric Specialty Exam: Physical Exam  Constitutional: He is oriented to person, place, and time. He appears well-developed and well-nourished.  HENT:  Head: Atraumatic.  Eyes: EOM are normal.  Respiratory:  Effort normal.  Musculoskeletal: Normal range of motion.  Neurological: He is alert and oriented to person, place, and time.    Review of Systems  Constitutional: Negative.   HENT: Negative.   Eyes: Negative.   Respiratory: Negative.   Gastrointestinal: Negative.   Genitourinary: Negative.   Musculoskeletal: Negative.   Skin: Negative.   Neurological: Negative.   Endo/Heme/Allergies: Negative.   Psychiatric/Behavioral: Negative.  Negative for depression, hallucinations, memory loss, substance abuse and suicidal ideas. The patient is not nervous/anxious and does not have insomnia.     Blood pressure (!) 138/99, pulse 73, temperature 98.2 F (36.8 C), temperature source Oral, resp. rate 20, height 5' 11" (1.803 m), weight 85.7 kg (189 lb), SpO2 100 %.Body mass index is 26.36 kg/m.  General Appearance: Well Groomed  Eye Contact:  Good  Speech:  Clear and Coherent  Volume:  Normal  Mood:  Euthymic  Affect:  Appropriate and Congruent  Thought Process:  Linear and Descriptions of Associations: Intact  Orientation:  Full (Time, Place, and Person)  Thought Content:  Logical and Hallucinations: None  Suicidal Thoughts:  No  Homicidal Thoughts:  No  Memory:  Immediate;   Good Recent;   Good Remote;   Good  Judgement:  Fair  Insight:  Shallow  Psychomotor Activity:  Normal  Concentration:  Concentration: Good and Attention Span: Good  Recall:  Good  Fund of Knowledge:  Good  Language:  Good  Akathisia:  No    AIMS (if indicated):     Assets:  Armed forces logistics/support/administrative officer Physical Health Social Support Vocational/Educational  ADL's:  Intact  Cognition:  WNL  Sleep:  Number of Hours: 7     Treatment Plan Summary: Patient is a 26 year old single Caucasian male with no prior psychiatric history other than substance abuse. He presented to [facility] emergency department last week after cutting his wrist superficially and overdosing on Tylenol.  Patient was hospitalized at [facility] where he received NAC.  Unspecified depressive disorder: Patient does not appear to meet criteria for major depressive disorder. Possible diagnoses are unspecified depressive disorder, adjustment disorder with depressed mood.  Patient is currently not interested in treatment with antidepressants. There is also no clear indication that he needs antidepressants at this point in time. We did recommended for the patient to engage in psychotherapy upon discharge.  Insomnia continue Benadryl 50 mg by mouth daily at bedtime when necessary  Tylenol overdose: Last LFTs on 4/24 were within the normal limits. Platelets 209  Tobacco  use disorder patient  Should continue nicotine patch 21 mg a day   Cannabis use disorder: Patient will benefit from individual psychotherapy addressing substance abuse issues. Pt declined   Polycystic kidney disease: Creatinine is 0.68 BUN is 10  Hypertension continue enalapril 5 mg by mouth daily---will change to lisinopril 5 mg as lisinopril, but not enalapril,  is in the $4 list  Diet low sodium  Precautions q 15 minutes  Vital signs daily  Hospitalization status involuntary commitment  Disposition: Will be discharged to his friend's house in [place] once stable. They will be picking him up when ready   F/u will refer to [facility] upon discharge. Lisinopril will be prescribed due to affordability. Set up primary care physician.   Labs TSH wnl   Hildred Priest, MD 05/01/2016, 12:34 PM

## 2016-05-01 NOTE — Tx Team (Addendum)
Interdisciplinary Treatment and Diagnostic Plan Update  05/01/2016 Time of Session: [place] MRN: [id number]  Principal Diagnosis: Depression  Secondary Diagnoses: Principal Problem:   Unspecified depressive disorder Active Problems:   Overdose on Tylenol   Tobacco abuse   PKD (polycystic kidney disease)   Hypertensive disorder   Cannabis use disorder, moderate, dependence (HCC)   Current Medications:  Current Facility-Administered Medications  Medication Dose Route Frequency Provider Last Rate Last Dose  . alum & mag hydroxide-simeth (MAALOX/MYLANTA) 200-200-20 MG/5ML suspension 30 mL  30 mL Oral Q4H PRN Hildred Priest, MD      . diphenhydrAMINE (BENADRYL) capsule 50 mg  50 mg Oral QHS PRN Hildred Priest, MD      . enalapril (VASOTEC) tablet 5 mg  5 mg Oral Daily Hildred Priest, MD   5 mg at 05/01/16 6808  . magnesium hydroxide (MILK OF MAGNESIA) suspension 30 mL  30 mL Oral Daily PRN Hildred Priest, MD      . nicotine (NICODERM CQ - dosed in mg/24 hours) patch 21 mg  21 mg Transdermal Daily Hildred Priest, MD      . senna-docusate (Senokot-S) tablet 1 tablet  1 tablet Oral QHS PRN Hildred Priest, MD       PTA Medications: Prescriptions Prior to Admission  Medication Sig Dispense Refill Last Dose  . amLODipine (NORVASC) 5 MG tablet Take 1 tablet (5 mg total) by mouth daily. 30 tablet 0   . nicotine (NICODERM CQ - DOSED IN MG/24 HOURS) 21 mg/24hr patch Place 1 patch (21 mg total) onto the skin daily. 28 patch 0     Patient Stressors: Financial difficulties Marital or family conflict Medication change or noncompliance Occupational concerns  Patient Strengths: Ability for insight Capable of independent living General fund of knowledge Work skills  Treatment Modalities: Medication Management, Group therapy, Case management,  1 to 1 session with clinician, Psychoeducation, Recreational  therapy.   Physician Treatment Plan for Primary Diagnosis: Depression Long Term Goal(s): Improvement in symptoms so as ready for discharge Improvement in symptoms so as ready for discharge   Short Term Goals: Ability to identify changes in lifestyle to reduce recurrence of condition will improve Ability to demonstrate self-control will improve Ability to identify triggers associated with substance abuse/mental health issues will improve Ability to identify and develop effective coping behaviors will improve  Medication Management: Evaluate patient's response, side effects, and tolerance of medication regimen.  Therapeutic Interventions: 1 to 1 sessions, Unit Group sessions and Medication administration.  Evaluation of Outcomes: Progressing  Physician Treatment Plan for Secondary Diagnosis: Principal Problem:   Unspecified depressive disorder Active Problems:   Overdose on Tylenol   Tobacco abuse   PKD (polycystic kidney disease)   Hypertensive disorder   Cannabis use disorder, moderate, dependence ([place])  Long Term Goal(s): Improvement in symptoms so as ready for discharge Improvement in symptoms so as ready for discharge   Short Term Goals: Ability to identify changes in lifestyle to reduce recurrence of condition will improve Ability to demonstrate self-control will improve Ability to identify triggers associated with substance abuse/mental health issues will improve Ability to identify and develop effective coping behaviors will improve     Medication Management: Evaluate patient's response, side effects, and tolerance of medication regimen.  Therapeutic Interventions: 1 to 1 sessions, Unit Group sessions and Medication administration.  Evaluation of Outcomes: Progressing   RN Treatment Plan for Primary Diagnosis: Depression Long Term Goal(s): Knowledge of disease and therapeutic regimen to maintain health will improve  Short  Term Goals: Ability to identify and develop  effective coping behaviors will improve  Medication Management: RN will administer medications as ordered by provider, will assess and evaluate patient's response and provide education to patient for prescribed medication. RN will report any adverse and/or side effects to prescribing provider.  Therapeutic Interventions: 1 on 1 counseling sessions, Psychoeducation, Medication administration, Evaluate responses to treatment, Monitor vital signs and CBGs as ordered, Perform/monitor CIWA, COWS, AIMS and Fall Risk screenings as ordered, Perform wound care treatments as ordered.  Evaluation of Outcomes: Progressing   LCSW Treatment Plan for Primary Diagnosis: Depression Long Term Goal(s): Safe transition to appropriate next level of care at discharge, Engage patient in therapeutic group addressing interpersonal concerns.  Short Term Goals: Engage patient in aftercare planning with referrals and resources and Increase social support  Therapeutic Interventions: Assess for all discharge needs, 1 to 1 time with Social worker, Explore available resources and support systems, Assess for adequacy in community support network, Educate family and significant other(s) on suicide prevention, Complete Psychosocial Assessment, Interpersonal group therapy.  Evaluation of Outcomes: Progressing    Recreational Therapy Treatment Plan for Primary Diagnosis: Depression Long Term Goal(s): Patient will participate in recreation therapy treatment in at least 2 group sessions without prompting from LRT  Short Term Goals: Increase healthy coping skills  Treatment Modalities: Group Therapy and Individual Treatment Sessions  Therapeutic Interventions: Psychoeducation  Evaluation of Outcomes: Met   Progress in Treatment: Attending groups: Yes. Participating in groups: Yes. Taking medication as prescribed: No. Toleration medication: No. Family/Significant other contact made: No, will contact:  friend Patient  understands diagnosis: Yes. Discussing patient identified problems/goals with staff: Yes. Medical problems stabilized or resolved: Yes. Denies suicidal/homicidal ideation: Yes. Issues/concerns per patient self-inventory: No. Other: none  New problem(s) identified: No, Describe:  none  New Short Term/Long Term Goal(s): PT goal is to achieve a quick discharge.  Discharge Plan or Barriers: Pt will be referred to outpatient services.  Reason for Continuation of Hospitalization: Depression  Estimated Length of Stay:1-2 days.  Attendees: Patient: Mitchell Mcdonald 05/01/2016   Physician: Dr. Jerilee Hoh, MD 05/01/2016   Nursing: Tyler Pita, RN 05/01/2016   RN Care Manager: 05/01/2016   Social Worker: Lurline Idol, LCSW 05/01/2016   Recreational Therapist: Drue Flirt, LRT/CTRS  05/01/2016   Other:  05/01/2016   Other:  05/01/2016   Other: 05/01/2016     Scribe for Treatment Team: Joanne Chars, LCSW 05/01/2016 11:59 AM

## 2016-05-01 NOTE — Progress Notes (Signed)
Recreation Therapy Notes  Date: 04.27.18 Time: 1:00 pm Location: Craft Room  Group Topic: Social Skills  Goal Area(s) Addresses:  Patient will effectively work with peer towards shared goal. Patient will identify skills used to make activity successful. Patient will identify benefit of using group skills effectively post d/c.  Behavioral Response: Attentive, Interactive  Intervention: [employer]  Activity: Patients were put into groups and were instructed to build a free standing tower out of 15 pipe cleaners. Patients were given 2 minutes to strategize. After about 5 minutes of building, patients were instructed to put their dominant hand behind their backs. After another 5 minutes of building, patients were instructed to stop talking to each other.  Education: LRT educated patients on healthy support systems.  Education Outcome: Acknowledges education/In group clarification offered  Clinical Observations/Feedback: Patient worked with peer to build tower. Patient used effective communication, problem solving, and teamwork skills. Patient contributed to group discussion by stating his team was successful, what skills he used during group, why these skills are important, and how he felt after using these skills in group.  Jacquelynn Cree, LRT/CTRS 05/01/2016 1:56 PM

## 2016-05-01 NOTE — Plan of Care (Signed)
Problem: [facility] Participation in Recreation Therapeutic Interventions Goal: STG-Patient will identify at least five coping skills for ** STG: Coping Skills - Within 4 treatment sessions, patient will verbalize at least 5 new coping skills in each of 2 treatment sessions to increase healthy coping skills.  Outcome: Completed/Met Date Met: 05/01/16 Treatment Session 2; Completed 2 out of 2: At approximately 2:40 pm. LRT met with patient in consultation room. Patient verbalized 10 new coping skills. LRT encouraged patient to use his coping skills when he needed to calm down.  Leonette Monarch, LRT/CTRS 04.27.18 3:06 pm

## 2016-05-02 MED ORDER — LISINOPRIL 5 MG PO TABS: 5.0000 mg | ORAL_TABLET | Freq: Every day | ORAL | Status: DC

## 2016-05-02 MED ORDER — LISINOPRIL 5 MG PO TABS: 5.0000 mg | ORAL_TABLET | Freq: Every day | ORAL | 0 refills | Status: DC

## 2016-05-02 MED ORDER — LISINOPRIL 5 MG PO TABS: 10.0000 mg | ORAL_TABLET | Freq: Every day | ORAL | Status: DC

## 2016-05-02 NOTE — Progress Notes (Signed)
Patient discharged home. Patient was stable and appreciative at that time. All papers and prescriptions were given and valuables returned. Verbal understanding expressed. Denies SI/HI and A/VH. Pt given opportunity to express concerns and ask questions.

## 2016-05-02 NOTE — Progress Notes (Signed)
  [facility] Adult Case Management Discharge Plan :  Will you be returning to the same living situation after discharge:  No. At discharge, do you have transportation home?: Yes,  friends Do you have the ability to pay for your medications: Yes,  Patient was referred to outpatient provider that works with uninsured patients.   Release of information consent forms completed and in the chart;  Patient's signature needed at discharge.  Patient to Follow up at: Follow-up Information     [facility]. Call.   Contact information: [address] [place] [address] [phone]       [facility]. Go in 7 day(s).   Specialty:  Behavioral Health Why:  Please attend your hospital follow up appointment to arrange therapy at [facility] during walk in hours, 8am-3pm, Monday through Friday. Contact information: [address] [place] [address] [phone]           Next level of care provider has access to [facility] Link:no  Safety Planning and Suicide Prevention discussed: Yes,  with patient  Have you used any form of tobacco in the last 30 days? (Cigarettes, Smokeless Tobacco, Cigars, and/or Pipes): Yes  Has patient been referred to the Quitline?: Patient refused referral  Patient has been referred for addiction treatment: Yes  Jaymes Hang G. Garnette Czech MSW, LCSWA 05/02/2016, 11:14 AM

## 2016-05-02 NOTE — Discharge Summary (Signed)
Physician Discharge Summary Note  Patient:  Mitchell Mcdonald is an 26 y.o., male MRN:  [id number] DOB:  [birth year] Patient phone:  [phone] (home)  Patient address:   [address] [place] [address],  Total Time spent with patient: 30 minutes  Date of Admission:  04/28/2016 Date of Discharge: 05/02/16  Reason for Admission:  OD  Principal Problem: Depression Discharge Diagnoses: Patient Active Problem List   Diagnosis Date Noted  . Unspecified depressive disorder [F32.9] 04/29/2016  . Cannabis use disorder, moderate, dependence ([place]) [F12.20] 04/29/2016  . Overdose on Tylenol [T39.1X1A] 04/27/2016  . Tobacco abuse [Z72.0] 04/27/2016  . PKD (polycystic kidney disease) [Q61.3] 12/23/2011  . Hypertensive disorder [I10] 01/07/2011    History of Present Illness:  26 year old single Caucasian male from [place]. The patient was referred by [facility] for psychiatric hospitalization due to recent suicidal attempt by  Tylenol overdose, along with superficial cutting on his wrists.  Patient's grandmother contacted the police on 4/74. The grandmother reported that the patient was yelling at her and told her he was going to kill himself. The patient cut his wrist and then ran out of the house. The police found the patient 3 blocks away from the house and he refused to come with them and became combative.  After several hours in the ER the patient reported ingesting Tylenol unknown amount in an attempt to harm himself.  His Tylenol level was 138.  Patient reports having multiple stressors over the last couple of weeks to months ago one of his closest friend was killed. He recently broke up with his girlfriend of 2 years whom he has been living with. He was forced to move in with his grandmother 2 weeks ago. The patient had purchased a car that he had been paying and was only $200 away from paying it off. The car unfortunately was under his girlfriend's name  and she refused to give it to him after they broke up. 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    Patient states that he does not get along well with his grandmother and they have been frequently arguing. She says that she talks to him about the past constantly.  Patient has an extensive legal history. He was found guilty of 66 felonies at the age of 51. He was in prison for 2 years. Since coming out of prison he has been charged with driving without a license, a hit-and-run, speeding tickets and cannabis possession.  Patient missed a court date this past Monday ( the day after overdose) and has another court date pending coming up soon.  Today the patient denies having any symptoms consistent with major depressive disorder. He denies having major issues with his sleep, appetite, energy concentration or with his functioning. He said that he has been feeling sad for the last several weeks due to losing his friend and losing his job and his car. He says that breaking up with his girlfriend was actually a good thing as they were now getting along well.  The patient has now spoken with a friend and he plans to move in with him instead of going back to his grandmother's house. He says that his friend has a job lined up for him as well.  He denies having any suicidality, homicidality or auditory or visual hallucinations. He denies hopelessness or helplessness.  Patient denies any history of trauma  Substance abuse patient says that when  he was charged with breaking and entering at the age of 50 he was also using cocaine and Xanax and marijuana. He said that he has not used any cocaine or Xanax since then. He does smoke marijuana about once a week. He drinks about 5 drinks once a week as well. He smokes between a quarter to half a pack a day.  Past Psychiatric History: Patient denies having any prior psychiatric history. He denies any psychiatric  hospitalizations. He denies any history of suicidal attempts. He denies any history of self injury  Past Medical History:  Past Medical History:  Diagnosis Date  . Depression   . HTN (hypertension)   . Polycystic kidney disease   . Polycystic kidney disease   . Suicide attempt by drug ingestion ([place])   . Tobacco abuse    History reviewed. No pertinent surgical history.  Family History:  Family History  Problem Relation Age of Onset  . Family history unknown: Yes   Family Psychiatric  History: Patient's father committed suicide when the patient was less than 36 years old. He never met his mother but believes she was involved with drugs.   Social History: Patient was raised by his mother's adoptive mother, patient refers to her as his grandmother. Patient is single, never married, he does not have any kids. As far as his education he completed a GED while in prison. Up until recently he was working as a Training and development officer at [employer] in [place]. He was in prison for 2 years due to felony charges for breaking and entering. Currently he has at least 3 charges pending for him wrong, driving without a license, cannabis possession. History  Alcohol Use  . 1.2 - 1.8 oz/week  . 2 - 3 Cans of beer per week    Comment: occassionaly, last drink over a week ago      History  Drug Use No    Social History   Social History  . Marital status: Single    Spouse name: N/A  . Number of children: N/A  . Years of education: N/A   Social History Main Topics  . Smoking status: Current Some Day Smoker    Packs/day: 0.50    Types: Cigarettes  . Smokeless tobacco: Never Used  . Alcohol use 1.2 - 1.8 oz/week    2 - 3 Cans of beer per week     Comment: occassionaly, last drink over a week ago   . Drug use: No  . Sexual activity: No   Other Topics Concern  . None   Social History Narrative  . None    Hospital Course:    Patient is a 26 year old single Caucasian male with no prior psychiatric  history other than substance abuse. He presented to [facility] emergency department last week after cutting his wrist superficially and overdosing on Tylenol.  Patient was hospitalized at [facility] where he received NAC.  Unspecified depressive disorder: Patient does not appear to meet criteria for major depressive disorder. Possible diagnoses are unspecified depressive disorder, adjustment disorder with depressed mood.  Patient is currently not interested in treatment with antidepressants. There is also no clear indication that he needs antidepressants at this point in time. We did recommended for the patient to engage in psychotherapy upon discharge.  Insomnia I will order Benadryl 50 mg by mouth daily at bedtime when necessary  Tylenol overdose: Last LFTs on 4/24 were within the normal limits. Platelets 209  Tobacco use disorder patient has orders for  nicotine patch 21 mg a day but later today the patient said he was not interested in getting a nicotine patch  Cannabis use disorder: Patient will benefit from individual psychotherapy addressing substance abuse issues  Polycystic kidney disease: Creatinine is 0.68 BUN is 10  Hypertension patient has been restarted on lisinopril 5 mg by mouth daily---Will f/u with [facility] in [place]   Disposition: Will be discharged to his friend's house in [place] today  F/u will refer to [facility] upon discharge  Labs TSH wnl  This hospitalization was uneventful. The patient was pleasant, calm and cooperative. He did not display any disruptive or unsafe behaviors. He participated actively in programming. He displayed appropriate interactions with peers and staff.  Today the patient was pleasant and cooperative. He denies depressed mood or problems with appetite, energy, sleep or concentration. He denies suicidality, homicidality or auditory or visual hallucinations. He denies feeling hopeless or helpless. There is no  evidence of psychosinia or hypomania.  Staff working with the patient does not voice any concerns about his safety upon discharge  Patient has denied having any access to guns  His being discharged only on lisinopril 5 mg a day for hypertension. He has been instructed to contact the community health and wellness in [place].  He has been provided with for [facility] in [place] however he is reporting he does not plan to follow-up.    Physical Findings: AIMS: Facial and Oral Movements Muscles of Facial Expression: None, normal Lips and Perioral Area: None, normal Jaw: None, normal Tongue: None, normal,Extremity Movements Upper (arms, wrists, hands, fingers): None, normal Lower (legs, knees, ankles, toes): None, normal, Trunk Movements Neck, shoulders, hips: None, normal, Overall Severity Severity of abnormal movements (highest score from questions above): None, normal Incapacitation due to abnormal movements: None, normal Patient's awareness of abnormal movements (rate only patient's report): No Awareness, Dental Status Current problems with teeth and/or dentures?: No Does patient usually wear dentures?: No  CIWA:    COWS:     Musculoskeletal: Strength & Muscle Tone: within normal limits Gait & Station: normal Patient leans: N/A  Psychiatric Specialty Exam: Physical Exam  Constitutional: He is oriented to person, place, and time. He appears well-developed and well-nourished.  HENT:  Head: Normocephalic and atraumatic.  Eyes: Conjunctivae and EOM are normal.  Neck: Normal range of motion.  Respiratory: Effort normal.  Musculoskeletal: Normal range of motion.  Neurological: He is alert and oriented to person, place, and time.    Review of Systems  Constitutional: Negative.   HENT: Negative.   Eyes: Negative.   Respiratory: Negative.   Cardiovascular: Negative.   Gastrointestinal: Negative.   Genitourinary: Negative.   Musculoskeletal: Negative.   Skin: Negative.    Neurological: Negative.   Endo/Heme/Allergies: Negative.   Psychiatric/Behavioral: Positive for substance abuse. Negative for depression, hallucinations, memory loss and suicidal ideas. The patient is not nervous/anxious and does not have insomnia.     Blood pressure 132/80, pulse 68, temperature 98 F (36.7 C), temperature source Oral, resp. rate 18, height 5' 11" (1.803 m), weight 85.7 kg (189 lb), SpO2 100 %.Body mass index is 26.36 kg/m.  General Appearance: Well Groomed  Eye Contact:  Good  Speech:  Clear and Coherent  Volume:  Normal  Mood:  Euthymic  Affect:  Appropriate and Congruent  Thought Process:  Linear and Descriptions of Associations: Intact  Orientation:  Full (Time, Place, and Person)  Thought Content:  Hallucinations: None  Suicidal Thoughts:  No  Homicidal Thoughts:  No  Memory:  Immediate;   Good Recent;   Good Remote;   Good  Judgement:  Good  Insight:  Good  Psychomotor Activity:  Normal  Concentration:  Concentration: Good and Attention Span: Good  Recall:  Good  Fund of Knowledge:  Good  Language:  Good  Akathisia:  No  Handed:    AIMS (if indicated):     Assets:  Armed forces logistics/support/administrative officer Social Support  ADL's:  Intact  Cognition:  WNL  Sleep:  Number of Hours: 6.45     Have you used any form of tobacco in the last 30 days? (Cigarettes, Smokeless Tobacco, Cigars, and/or Pipes): Yes  Has this patient used any form of tobacco in the last 30 days? (Cigarettes, Smokeless Tobacco, Cigars, and/or Pipes) Yes, Yes, A prescription for an FDA-approved tobacco cessation medication was offered at discharge and the patient refused  Blood Alcohol level:  Lab Results  Component Value Date   ETH <5 99/83/3825    Metabolic Disorder Labs:  No results found for: HGBA1C, MPG No results found for: PROLACTIN No results found for: CHOL, TRIG, HDL, CHOLHDL, VLDL, [facility]   Results for DYER, KLUG (MRN [id number]) as of 05/02/2016 10:31  Ref. Range 04/28/2016 00:58  04/28/2016 07:18 04/28/2016 07:19 04/28/2016 07:25 04/29/2016 07:30  COMPREHENSIVE METABOLIC PANEL Unknown Rpt (A) Rpt (A)     Sodium Latest Ref Range: 135 - 145 mmol/L 139 140     Potassium Latest Ref Range: 3.5 - 5.1 mmol/L 3.6 3.8     Chloride Latest Ref Range: 101 - 111 mmol/L 111 111     CO2 Latest Ref Range: 22 - 32 mmol/L 22 22     Glucose Latest Ref Range: 65 - 99 mg/dL 100 (H) 101 (H)     BUN Latest Ref Range: 6 - 20 mg/dL 10 8     Creatinine Latest Ref Range: 0.61 - 1.24 mg/dL 0.76 0.68     Calcium Latest Ref Range: 8.9 - 10.3 mg/dL 8.7 (L) 8.7 (L)     Anion gap Latest Ref Range: 5 - _0 Alkaline Phosphatase Latest Ref Range: 38 - 126 U/L 45 40     Albumin Latest Ref Range: 3.5 - 5.0 g/dL 3.6 3.7     AST Latest Ref Range: 15 - 41 U/L 15 14 (L)     ALT Latest Ref Range: 17 - 63 U/L 18 19     Total Protein Latest Ref Range: 6.5 - 8.1 g/dL 6.5 6.4 (L)     Total Bilirubin Latest Ref Range: 0.3 - 1.2 mg/dL 0.4 0.7     EGFR (African American) Latest Ref Range: >60 mL/min >60 >60     EGFR (Non-African Amer.) Latest Ref Range: >60 mL/min >60 >60     WBC Latest Ref Range: 4.0 - 10.5 K/uL  6.6     RBC Latest Ref Range: 4.22 - 5.81 MIL/uL  4.97     Hemoglobin Latest Ref Range: 13.0 - 17.0 g/dL  15.7     HCT Latest Ref Range: 39.0 - 52.0 %  44.4     MCV Latest Ref Range: 78.0 - 100.0 fL  89.3     MCH Latest Ref Range: 26.0 - 34.0 pg  31.6     MCHC Latest Ref Range: 30.0 - 36.0 g/dL  35.4     RDW Latest Ref Range: 11.5 - 15.5 %  12.8     Platelets Latest Ref Range: 150 - 400  K/uL  209     Prothrombin Time Latest Ref Range: 11.4 - 15.2 seconds  14.3     INR Unknown  1.11     Acetaminophen (Tylenol), S Latest Ref Range: 10 - 30 ug/mL <10 (L)  <10 (L)    TSH Latest Ref Range: 0.350 - 4.500 uIU/mL     1.753    See Psychiatric Specialty Exam and Suicide Risk Assessment completed by Attending Physician prior to discharge.  Discharge destination:  Home  Is patient on multiple  antipsychotic therapies at discharge:  No   Has Patient had three or more failed trials of antipsychotic monotherapy by history:  No  Recommended Plan for Multiple Antipsychotic Therapies: NA   Allergies as of 05/02/2016      Reactions   Ibuprofen Other (See Comments)   Kidney specialist advised him not to take it   Nsaids    Kidney specialist advised him not to take it       Medication List    STOP taking these medications   amLODipine 5 MG tablet Commonly known as:  NORVASC   nicotine 21 mg/24hr patch Commonly known as:  NICODERM CQ - dosed in mg/24 hours     TAKE these medications     Indication  lisinopril 5 MG tablet Commonly known as:  PRINIVIL,ZESTRIL Take 1 tablet (5 mg total) by mouth daily.  Indication:  High Blood Pressure Disorder      Follow-up Information    [facility]. Call.   Contact information: [place] [id number] [place]. Go in 7 day(s).   Specialty:  Behavioral Health Why:  Please attend your hospital follow up appointment to arrange therapy at [facility] during walk in hours, 8am-3pm, Monday through Friday. Contact information: [place] [address] [phone]          >30 minutes. >50 % of the time was spent in coordination of care  Signed: Hildred Priest, MD 05/02/2016, 10:27 AM

## 2016-05-02 NOTE — Progress Notes (Signed)
Denies SI/HI/AVH. Appropriate interactions with staff and peers. Denies pain. Voices no additional concerns at this time. Encouragement and support provided. Safety maintained. Will continue to monitor.

## 2016-05-02 NOTE — Plan of Care (Signed)
Problem: Coping: Goal: Ability to cope will improve Outcome: Progressing Working  On Materials engineer

## 2016-05-02 NOTE — Plan of Care (Signed)
Problem: Safety: Goal: Ability to remain free from injury will improve Outcome: Progressing Pt has remained free from injury   

## 2016-05-02 NOTE — BHH Suicide Risk Assessment (Signed)
[facility] Discharge Suicide Risk Assessment   Principal Problem: Depression Discharge Diagnoses:  Patient Active Problem List   Diagnosis Date Noted  . Unspecified depressive disorder [F32.9] 04/29/2016  . Cannabis use disorder, moderate, dependence ([facility]) [F12.20] 04/29/2016  . Overdose on Tylenol [T39.1X1A] 04/27/2016  . Tobacco abuse [Z72.0] 04/27/2016  . PKD (polycystic kidney disease) [Q61.3] 12/23/2011  . Hypertensive disorder [I10] 01/07/2011     Psychiatric Specialty Exam: ROS  Blood pressure 132/80, pulse 68, temperature 98 F (36.7 C), temperature source Oral, resp. rate 18, height  (1.803 m), weight 85.7 kg (189 lb), SpO2 100 %.Body mass index is 26.36 kg/m.                                                       Mental Status Per Nursing Assessment::   On Admission:  Suicidal ideation indicated by patient, Self-harm behaviors  Demographic Factors:  Male, Adolescent or young adult, Caucasian, Low socioeconomic status and Unemployed  Loss Factors: Loss of significant relationship, Legal issues and Financial problems/change in socioeconomic status  Historical Factors: Family history of mental illness or substance abuse and Impulsivity  Risk Reduction Factors:   Sense of responsibility to family and Positive social support  Continued Clinical Symptoms:  Alcohol/Substance Abuse/Dependencies  Cognitive Features That Contribute To Risk:  Closed-mindedness    Suicide Risk:  Minimal: No identifiable suicidal ideation.  Patients presenting with no risk factors but with morbid ruminations; may be classified as minimal risk based on the severity of the depressive symptoms  Follow-up Information    [facility]. Call.   Contact information: [address] [place] [address] [phone]       [facility]. Go in 7 day(s).   Specialty:  Behavioral Health Why:  Please attend your hospital follow  up appointment to arrange therapy at [facility] during walk in hours, 8am-3pm, Monday through Friday. Contact information: [address] [place] [address] [phone]             Jimmy Footman, MD 05/02/2016, 10:26 AM

## 2016-05-02 NOTE — Progress Notes (Signed)
D: Affect cheerful fon approach . Patient stated slept good last night .Stated appetite is good and energy level  Is normal. Stated concentration is good . Stated on Depression scale , hopeless and anxiety .( low 0-10 high) Denies suicidal  homicidal ideations  .  No auditory hallucinations  No pain concerns . Appropriate ADL'S. Interacting with peers and staff.  Voice of his goal today  Going Home  And work on Materials engineer  A: Encourage patient participation with unit programming . Instruction  Given on  Medication , verbalize understanding. R: Voice no other concerns. Staff continue to monitor

## 2016-05-04 NOTE — Progress Notes (Signed)
Recreation Therapy Notes  INPATIENT RECREATION TR PLAN  Patient Details Name: Mitchell Mcdonald MRN: [id number] DOB: 1990/03/19 Today's Date: 05/04/2016  Rec Therapy Plan Is patient appropriate for Therapeutic Recreation?: Yes Treatment times per week: At least once a week TR Treatment/Interventions: 1:1 session, Group participation (Comment) (Appropriate participation in daily recreational therapy tx)  Discharge Criteria Pt will be discharged from therapy if:: Treatment goals are met, Discharged Treatment plan/goals/alternatives discussed and agreed upon by:: Patient/family  Discharge Summary Short term goals set: See Care Plan Short term goals met: Complete Progress toward goals comments: One-to-one attended Which groups?: Leisure education, Social skills One-to-one attended: Coping skills Reason goals not met: N/A Therapeutic equipment acquired: None Reason patient discharged from therapy: Discharge from hospital Pt/family agrees with progress & goals achieved: Yes Date patient discharged from therapy: 05/02/16   Leonette Monarch, LRT/CTRS 05/04/2016, 11:59 AM

## 2017-12-15 ENCOUNTER — Emergency Department (HOSPITAL_COMMUNITY)
Admission: EM | Admit: 2017-12-15 | Discharge: 2017-12-15 | Disposition: A | Discharge status: Home / Self Care | Payer: Self-pay | Attending: Emergency Medicine | Admitting: Emergency Medicine

## 2017-12-15 ENCOUNTER — Encounter (HOSPITAL_COMMUNITY): Payer: Self-pay | Admitting: Emergency Medicine

## 2017-12-15 ENCOUNTER — Emergency Department (HOSPITAL_COMMUNITY): Payer: Self-pay

## 2017-12-15 ENCOUNTER — Other Ambulatory Visit: Payer: Self-pay

## 2017-12-15 DIAGNOSIS — Z79899 Other long term (current) drug therapy: Secondary | ICD-10-CM | POA: Insufficient documentation

## 2017-12-15 DIAGNOSIS — F1220 Cannabis dependence, uncomplicated: Secondary | ICD-10-CM | POA: Insufficient documentation

## 2017-12-15 DIAGNOSIS — F329 Major depressive disorder, single episode, unspecified: Secondary | ICD-10-CM | POA: Insufficient documentation

## 2017-12-15 DIAGNOSIS — F17210 Nicotine dependence, cigarettes, uncomplicated: Secondary | ICD-10-CM | POA: Insufficient documentation

## 2017-12-15 DIAGNOSIS — I10 Essential (primary) hypertension: Secondary | ICD-10-CM | POA: Insufficient documentation

## 2017-12-15 DIAGNOSIS — M79672 Pain in left foot: Secondary | ICD-10-CM | POA: Insufficient documentation

## 2017-12-15 NOTE — ED Provider Notes (Signed)
[facility]-EMERGENCY DEPT Provider Note   CSN: [id number] Arrival date & time: 12/15/17  1456     History   Chief Complaint Chief Complaint  Patient presents with  . Foot Pain    HPI Mitchell Mcdonald is a 27 y.o. male.  The history is provided by the patient. No language interpreter was used.  Foot Pain  This is a new problem. Episode onset: 2 months. The problem occurs constantly. The problem has not changed since onset.Nothing aggravates the symptoms. Nothing relieves the symptoms. He has tried nothing for the symptoms. The treatment provided no relief.  Pt reports he stepped on glass in the past.  Pt reports he has painful lumps coming up on his foot.   Past Medical History:  Diagnosis Date  . Depression   . HTN (hypertension)   . Polycystic kidney disease   . Polycystic kidney disease   . Suicide attempt by drug ingestion ([facility])   . Tobacco abuse     Patient Active Problem List   Diagnosis Date Noted  . Unspecified depressive disorder 04/29/2016  . Cannabis use disorder, moderate, dependence ([facility]) 04/29/2016  . Overdose on Tylenol 04/27/2016  . Tobacco abuse 04/27/2016  . PKD (polycystic kidney disease) 12/23/2011  . Hypertensive disorder 01/07/2011    History reviewed. No pertinent surgical history.      Home Medications    Prior to Admission medications   Medication Sig Start Date End Date Taking? Authorizing Provider  lisinopril (PRINIVIL,ZESTRIL) 5 MG tablet Take 1 tablet (5 mg total) by mouth daily. 05/02/16   Jimmy Footman, MD    Family History Family History  Family history unknown: Yes    Social History Social History   Tobacco Use  . Smoking status: Current Some Day Smoker    Packs/day: 0.50    Types: Cigarettes  . Smokeless tobacco: Never Used  Substance Use Topics  . Alcohol use: Yes    Alcohol/week: 2.0 - 3.0 standard drinks    Types: 2 - 3 Cans of beer per week    Comment: occassionaly, last drink  over a week ago   . Drug use: No     Allergies   Ibuprofen and Nsaids   Review of Systems Review of Systems  Musculoskeletal: Positive for joint swelling and myalgias.  Skin: Positive for wound.  All other systems reviewed and are negative.    Physical Exam Updated Vital Signs BP (!) 128/107 (BP Location: Left Arm)   Pulse 83   Temp 99.1 F (37.3 C) (Oral)   Resp 18   Ht 5\' 10"  (1.778 m)   Wt 86.2 kg   SpO2 99%   BMI 27.26 kg/m   Physical Exam  Constitutional: He appears well-developed and well-nourished.  HENT:  Head: Normocephalic.  Cardiovascular: Normal rate.  Pulmonary/Chest: Effort normal.  Musculoskeletal: Normal range of motion.  Yellow tender areas under 1st metatarsal area  No obvious glass  Neurological: He is alert.  Skin: Skin is warm.  Psychiatric: He has a normal mood and affect.  Nursing note and vitals reviewed.    ED Treatments / Results  Labs (all labs ordered are listed, but only abnormal results are displayed) Labs Reviewed - No data to display  EKG None  Radiology Dg Foot Complete Left  Result Date: 12/15/2017 CLINICAL DATA:  27 year old male with progressive left foot pain for the past 2 months. Stepped on glass about a year ago. EXAM: LEFT FOOT - COMPLETE 3+ VIEW COMPARISON:  Left ankle  series 01/07/2011. FINDINGS: Markers placed along the plantar surface of the foot corresponding to the penetrating trauma which is at the distal metatarsal level. No radiopaque foreign body identified. No subcutaneous gas. Bone mineralization is within normal limits. Normal joint spaces and alignment. No osseous abnormality identified. IMPRESSION: No osseous abnormality or radiopaque foreign body identified; note that not all glass is radiopaque. Electronically Signed   By: Odessa FlemingH  Hall M.D.   On: 12/15/2017 15:32    Procedures Procedures (including critical care time)  Medications Ordered in ED Medications - No data to display   Initial Impression  / Assessment and Plan / ED Course  I have reviewed the triage vital signs and the nursing notes.  Pertinent labs & imaging results that were available during my care of the patient were reviewed by me and considered in my medical decision making (see chart for details).     MDM xray no fractures.  Possible callus vs warts.  Pt advised to schedule to see Podiatrist for evaltuion   Final Clinical Impressions(s) / ED Diagnoses   Final diagnoses:  Foot pain, left    ED Discharge Orders    None    An After Visit Summary was printed and given to the patient.    Osie CheeksSofia, Leslie K, PA-C 12/15/17 2115    Shaune PollackIsaacs, Cameron, MD 12/16/17 [phone]

## 2017-12-15 NOTE — ED Triage Notes (Signed)
Patient reports stepping on glass x1 year ago. Reports worsening pain in left foot x2 months.

## 2017-12-15 NOTE — Discharge Instructions (Signed)
Keep feet dry.  Change socks when they become wet

## 2017-12-21 ENCOUNTER — Ambulatory Visit: Payer: Self-pay | Admitting: Podiatry

## 2017-12-31 ENCOUNTER — Ambulatory Visit: Payer: Self-pay | Admitting: Podiatry

## 2018-01-06 ENCOUNTER — Ambulatory Visit: Payer: Self-pay | Admitting: Podiatry

## 2019-04-13 ENCOUNTER — Ambulatory Visit: Payer: Medicaid Other | Attending: Internal Medicine

## 2020-09-11 ENCOUNTER — Ambulatory Visit (HOSPITAL_COMMUNITY): Admit: 2020-09-11 | Discharge status: Against Medical Advice | Payer: Self-pay

## 2020-09-11 ENCOUNTER — Other Ambulatory Visit: Payer: Self-pay

## 2020-09-11 ENCOUNTER — Encounter (HOSPITAL_COMMUNITY): Payer: Self-pay | Admitting: Emergency Medicine

## 2020-09-11 ENCOUNTER — Ambulatory Visit (HOSPITAL_COMMUNITY)
Admission: EM | Admit: 2020-09-11 | Discharge: 2020-09-11 | Disposition: A | Discharge status: Home / Self Care | Payer: Medicaid Other | Attending: Physician Assistant | Admitting: Physician Assistant

## 2020-09-11 DIAGNOSIS — Q613 Polycystic kidney, unspecified: Secondary | ICD-10-CM | POA: Insufficient documentation

## 2020-09-11 DIAGNOSIS — N390 Urinary tract infection, site not specified: Secondary | ICD-10-CM | POA: Insufficient documentation

## 2020-09-11 LAB — CBC WITH DIFFERENTIAL/PLATELET
Abs Immature Granulocytes: 0.06 10*3/uL (ref 0.00–0.07)
Basophils Absolute: 0 10*3/uL (ref 0.0–0.1)
Basophils Relative: 0 %
Eosinophils Absolute: 0.1 10*3/uL (ref 0.0–0.5)
Eosinophils Relative: 1 %
HCT: 47.9 % (ref 39.0–52.0)
Hemoglobin: 16.9 g/dL (ref 13.0–17.0)
Immature Granulocytes: 0 %
Lymphocytes Relative: 15 %
Lymphs Abs: 2.2 10*3/uL (ref 0.7–4.0)
MCH: 32.4 pg (ref 26.0–34.0)
MCHC: 35.3 g/dL (ref 30.0–36.0)
MCV: 91.8 fL (ref 80.0–100.0)
Monocytes Absolute: 1.1 10*3/uL — ABNORMAL HIGH (ref 0.1–1.0)
Monocytes Relative: 7 %
Neutro Abs: 11.1 10*3/uL — ABNORMAL HIGH (ref 1.7–7.7)
Neutrophils Relative %: 77 %
Platelets: 302 10*3/uL (ref 150–400)
RBC: 5.22 MIL/uL (ref 4.22–5.81)
RDW: 12.1 % (ref 11.5–15.5)
WBC: 14.6 10*3/uL — ABNORMAL HIGH (ref 4.0–10.5)
nRBC: 0 % (ref 0.0–0.2)

## 2020-09-11 LAB — POCT URINALYSIS DIPSTICK, ED / UC
Bilirubin Urine: NEGATIVE
Glucose, UA: NEGATIVE mg/dL
Nitrite: POSITIVE — AB
Protein, ur: >=300 mg/dL — AB
Specific Gravity, Urine: 1.025 (ref 1.005–1.030)
Urobilinogen, UA: 1 mg/dL (ref 0.0–1.0)
pH: 7 (ref 5.0–8.0)

## 2020-09-11 LAB — COMPREHENSIVE METABOLIC PANEL
ALT: 19 U/L (ref 0–44)
AST: 18 U/L (ref 15–41)
Albumin: 4 g/dL (ref 3.5–5.0)
Alkaline Phosphatase: 61 U/L (ref 38–126)
Anion gap: 8 (ref 5–15)
BUN: 16 mg/dL (ref 6–20)
CO2: 26 mmol/L (ref 22–32)
Calcium: 9.6 mg/dL (ref 8.9–10.3)
Chloride: 103 mmol/L (ref 98–111)
Creatinine, Ser: 1.09 mg/dL (ref 0.61–1.24)
GFR, Estimated: >60 mL/min (ref >60)
Glucose, Bld: 103 mg/dL — ABNORMAL HIGH (ref 70–99)
Potassium: 4.4 mmol/L (ref 3.5–5.1)
Sodium: 137 mmol/L (ref 135–145)
Total Bilirubin: 0.6 mg/dL (ref 0.3–1.2)
Total Protein: 7.6 g/dL (ref 6.5–8.1)

## 2020-09-11 MED ORDER — CIPROFLOXACIN HCL 500 MG PO TABS: 500.0000 mg | ORAL_TABLET | Freq: Every day | ORAL | 0 refills | Status: AC

## 2020-09-11 NOTE — Discharge Instructions (Addendum)
Take antibiotics as prescribed. Report to ED with ANY worsening symptoms.

## 2020-09-11 NOTE — ED Triage Notes (Signed)
Pt reports abd pains and cloudy urine and "tingling when I pee" that started today.

## 2020-09-11 NOTE — ED Provider Notes (Signed)
[facility]    CSN: [id number] Arrival date & time: 09/11/20  1539      History   Chief Complaint Chief Complaint  Patient presents with   Abdominal Pain    HPI Mitchell Mcdonald is a 30 y.o. male.   Patient here today for evaluation of left flank pain that started earlier today.  He reports that he has not had any nausea or vomiting.  He does admit to some cloudy urine, and has known polycystic kidney disease.  He has not had follow-up for this in quite a while.  He has not had any fever, or other symptoms.  He denies any concerns for STDs, and states that his only partner was recently tested for STDs.  The history is provided by the patient.  Abdominal Pain Associated symptoms: dysuria   Associated symptoms: no chills, no fever, no nausea, no shortness of breath and no vomiting    Past Medical History:  Diagnosis Date   Depression    HTN (hypertension)    Polycystic kidney disease    Polycystic kidney disease    Suicide attempt by drug ingestion (HCC)    Tobacco abuse     Patient Active Problem List   Diagnosis Date Noted   Unspecified depressive disorder 04/29/2016   Cannabis use disorder, moderate, dependence (HCC) 04/29/2016   Overdose on Tylenol 04/27/2016   Tobacco abuse 04/27/2016   PKD (polycystic kidney disease) 12/23/2011   Hypertensive disorder 01/07/2011    History reviewed. No pertinent surgical history.     Home Medications    Prior to Admission medications   Medication Sig Start Date End Date Taking? Authorizing Provider  ciprofloxacin (CIPRO) 500 MG tablet Take 1 tablet (500 mg total) by mouth daily with breakfast for 5 days. 09/11/20 09/16/20 Yes Tomi Bamberger, PA-C  lisinopril (PRINIVIL,ZESTRIL) 5 MG tablet Take 1 tablet (5 mg total) by mouth daily. 05/02/16   Jimmy Footman, MD    Family History Family History  Family history unknown: Yes    Social History Social History   Tobacco Use   Smoking status: Some Days     Packs/day: 0.50    Types: Cigarettes   Smokeless tobacco: Never  Substance Use Topics   Alcohol use: Yes    Alcohol/week: 2.0 - 3.0 standard drinks    Types: 2 - 3 Cans of beer per week    Comment: occassionaly, last drink over a week ago    Drug use: No     Allergies   Ibuprofen and Nsaids   Review of Systems Review of Systems  Constitutional:  Negative for chills and fever.  Eyes:  Negative for discharge and redness.  Respiratory:  Negative for shortness of breath.   Gastrointestinal:  Positive for abdominal pain. Negative for nausea and vomiting.  Genitourinary:  Positive for dysuria and flank pain.    Physical Exam Triage Vital Signs ED Triage Vitals  Enc Vitals Group     BP 09/11/20 1701 (!) 150/97     Pulse Rate 09/11/20 1701 80     Resp 09/11/20 1701 17     Temp 09/11/20 1701 98.7 F (37.1 C)     Temp Source 09/11/20 1701 Oral     SpO2 09/11/20 1701 97 %     Weight --      Height --      Head Circumference --      Peak Flow --      Pain Score 09/11/20 1700 5  Pain Loc --      Pain Edu? --      Excl. in GC? --    No data found.  Updated Vital Signs BP (!) 150/97 (BP Location: Left Arm)   Pulse 80   Temp 98.7 F (37.1 C) (Oral)   Resp 17   SpO2 97%     Physical Exam Vitals and nursing note reviewed.  Constitutional:      General: He is not in acute distress.    Appearance: He is well-developed. He is not ill-appearing.  HENT:     Head: Normocephalic and atraumatic.  Cardiovascular:     Rate and Rhythm: Normal rate and regular rhythm.     Heart sounds: Normal heart sounds. No murmur heard. Pulmonary:     Effort: Pulmonary effort is normal.     Breath sounds: Normal breath sounds. No wheezing, rhonchi or rales.  Abdominal:     General: Bowel sounds are normal.     Tenderness: There is no abdominal tenderness. There is left CVA tenderness. There is no right CVA tenderness.  Skin:    General: Skin is warm and dry.  Neurological:      Mental Status: He is alert.  Psychiatric:        Mood and Affect: Mood normal.        Behavior: Behavior normal.     UC Treatments / Results  Labs (all labs ordered are listed, but only abnormal results are displayed) Labs Reviewed  POCT URINALYSIS DIPSTICK, ED / UC - Abnormal; Notable for the following components:      Result Value   Ketones, ur TRACE (*)    Hgb urine dipstick LARGE (*)    Protein, ur >=300 (*)    Nitrite POSITIVE (*)    Leukocytes,Ua SMALL (*)    All other components within normal limits  URINE CULTURE  CBC WITH DIFFERENTIAL/PLATELET  COMPREHENSIVE METABOLIC PANEL    EKG   Radiology No results found.  Procedures Procedures (including critical care time)  Medications Ordered in UC Medications - No data to display  Initial Impression / Assessment and Plan / UC Course  I have reviewed the triage vital signs and the nursing notes.  Pertinent labs & imaging results that were available during my care of the patient were reviewed by me and considered in my medical decision making (see chart for details).  UA with signs of infection as well as proteinuria, will prescribe antibiotic, and urine culture ordered.  CBC and CMP also ordered.  Strict precautions provided to report to ED with any worsening symptoms.  Final Clinical Impressions(s) / UC Diagnoses   Final diagnoses:  Lower urinary tract infectious disease  Polycystic kidney disease     Discharge Instructions      Take antibiotics as prescribed. Report to ED with ANY worsening symptoms.      ED Prescriptions     Medication Sig Dispense Auth. Provider   ciprofloxacin (CIPRO) 500 MG tablet Take 1 tablet (500 mg total) by mouth daily with breakfast for 5 days. 5 tablet Tomi Bamberger, PA-C      PDMP not reviewed this encounter.   Tomi Bamberger, PA-C 09/11/20 [phone]

## 2020-09-13 LAB — URINE CULTURE: Culture: >=100000 — AB

## 2020-09-14 ENCOUNTER — Other Ambulatory Visit: Payer: Self-pay

## 2020-09-14 ENCOUNTER — Emergency Department (HOSPITAL_COMMUNITY)
Admission: EM | Admit: 2020-09-14 | Discharge: 2020-09-14 | Disposition: A | Discharge status: Home / Self Care | Payer: Medicaid Other | Attending: Emergency Medicine | Admitting: Emergency Medicine

## 2020-09-14 DIAGNOSIS — Z5321 Procedure and treatment not carried out due to patient leaving prior to being seen by health care provider: Secondary | ICD-10-CM | POA: Insufficient documentation

## 2020-09-14 DIAGNOSIS — M546 Pain in thoracic spine: Secondary | ICD-10-CM | POA: Insufficient documentation

## 2020-09-14 DIAGNOSIS — M545 Low back pain, unspecified: Secondary | ICD-10-CM | POA: Insufficient documentation

## 2020-09-14 NOTE — ED Triage Notes (Signed)
Pt reports back pain bilateral lumbar and thoracic. States the pain is usually on one side or the other. Was seen at [facility] yesterday. Hx of kidney disease

## 2020-10-17 ENCOUNTER — Ambulatory Visit: Admit: 2020-10-17 | Discharge status: Absent without leave | Payer: Self-pay

## 2020-10-17 ENCOUNTER — Other Ambulatory Visit: Payer: Self-pay

## 2020-10-17 ENCOUNTER — Encounter (HOSPITAL_COMMUNITY): Payer: Self-pay | Admitting: *Deleted

## 2020-10-17 ENCOUNTER — Ambulatory Visit (HOSPITAL_COMMUNITY)
Admission: EM | Admit: 2020-10-17 | Discharge: 2020-10-17 | Disposition: A | Discharge status: Home / Self Care | Payer: Medicaid Other | Attending: Physician Assistant | Admitting: Physician Assistant

## 2020-10-17 DIAGNOSIS — N390 Urinary tract infection, site not specified: Secondary | ICD-10-CM

## 2020-10-17 LAB — POCT URINALYSIS DIPSTICK, ED / UC
Glucose, UA: 250 mg/dL — AB
Ketones, ur: 15 mg/dL — AB
Nitrite: POSITIVE — AB
Protein, ur: 100 mg/dL — AB
Specific Gravity, Urine: 1.02 (ref 1.005–1.030)
Urobilinogen, UA: 4 mg/dL — ABNORMAL HIGH (ref 0.0–1.0)
pH: 6 (ref 5.0–8.0)

## 2020-10-17 MED ORDER — CEPHALEXIN 500 MG PO CAPS: 500.0000 mg | ORAL_CAPSULE | Freq: Four times a day (QID) | ORAL | 0 refills | Status: AC

## 2020-10-17 NOTE — Discharge Instructions (Signed)
Return if any problems.

## 2020-10-17 NOTE — ED Triage Notes (Signed)
Pt reports tip of penis looks  irritated and has had dysuria for past 4 days.

## 2020-10-17 NOTE — ED Provider Notes (Signed)
[facility]    CSN: [id number] Arrival date & time: 10/17/20  1654      History   Chief Complaint Chief Complaint  Patient presents with   Dysuria   irritation to tip of penis    HPI Mitchell Mcdonald is a 30 y.o. male.   The history is provided by the patient. No language interpreter was used.  Dysuria Presenting symptoms: dysuria   Context: after urination   Relieved by:  Nothing Worsened by:  Nothing Ineffective treatments:  None tried Associated symptoms: no abdominal pain and no hematuria   Risk factors: no recent infection, no STI exposure and no unprotected sex    Past Medical History:  Diagnosis Date   Depression    HTN (hypertension)    Polycystic kidney disease    Polycystic kidney disease    Suicide attempt by drug ingestion (HCC)    Tobacco abuse     Patient Active Problem List   Diagnosis Date Noted   Unspecified depressive disorder 04/29/2016   Cannabis use disorder, moderate, dependence (HCC) 04/29/2016   Overdose on Tylenol 04/27/2016   Tobacco abuse 04/27/2016   PKD (polycystic kidney disease) 12/23/2011   Hypertensive disorder 01/07/2011    History reviewed. No pertinent surgical history.     Home Medications    Prior to Admission medications   Medication Sig Start Date End Date Taking? Authorizing Provider  cephALEXin (KEFLEX) 500 MG capsule Take 1 capsule (500 mg total) by mouth 4 (four) times daily for 7 days. 10/17/20 10/24/20 Yes Cheron Schaumann K, PA-C  lisinopril (PRINIVIL,ZESTRIL) 5 MG tablet Take 1 tablet (5 mg total) by mouth daily. 05/02/16   Jimmy Footman, MD    Family History Family History  Family history unknown: Yes    Social History Social History   Tobacco Use   Smoking status: Some Days    Packs/day: 0.50    Types: Cigarettes   Smokeless tobacco: Never  Substance Use Topics   Alcohol use: Yes    Alcohol/week: 2.0 - 3.0 standard drinks    Types: 2 - 3 Cans of beer per week    Comment:  occassionaly, last drink over a week ago    Drug use: No     Allergies   Ibuprofen and Nsaids   Review of Systems Review of Systems  Gastrointestinal:  Negative for abdominal pain.  Genitourinary:  Positive for dysuria. Negative for hematuria.  All other systems reviewed and are negative.   Physical Exam Triage Vital Signs ED Triage Vitals  Enc Vitals Group     BP 10/17/20 1737 (!) 153/108     Pulse Rate 10/17/20 1737 84     Resp 10/17/20 1737 20     Temp 10/17/20 1737 98.4 F (36.9 C)     Temp src --      SpO2 10/17/20 1737 95 %     Weight --      Height --      Head Circumference --      Peak Flow --      Pain Score 10/17/20 1733 7     Pain Loc --      Pain Edu? --      Excl. in GC? --    No data found.  Updated Vital Signs BP (!) 153/108   Pulse 84   Temp 98.4 F (36.9 C)   Resp 20   SpO2 95%   Visual Acuity Right Eye Distance:   Left Eye Distance:  Bilateral Distance:    Right Eye Near:   Left Eye Near:    Bilateral Near:     Physical Exam Vitals reviewed.  Constitutional:      Appearance: Normal appearance.  Cardiovascular:     Rate and Rhythm: Normal rate.  Pulmonary:     Effort: Pulmonary effort is normal.  Abdominal:     General: Abdomen is flat.  Skin:    General: Skin is warm.  Neurological:     General: No focal deficit present.     Mental Status: He is alert.  Psychiatric:        Mood and Affect: Mood normal.     UC Treatments / Results  Labs (all labs ordered are listed, but only abnormal results are displayed) Labs Reviewed  POCT URINALYSIS DIPSTICK, ED / UC - Abnormal; Notable for the following components:      Result Value   Glucose, UA 250 (*)    Bilirubin Urine SMALL (*)    Ketones, ur 15 (*)    Hgb urine dipstick TRACE (*)    Protein, ur 100 (*)    Urobilinogen, UA 4.0 (*)    Nitrite POSITIVE (*)    Leukocytes,Ua LARGE (*)    All other components within normal limits  URINE CULTURE     EKG   Radiology No results found.  Procedures Procedures (including critical care time)  Medications Ordered in UC Medications - No data to display  Initial Impression / Assessment and Plan / UC Course  I have reviewed the triage vital signs and the nursing notes.  Pertinent labs & imaging results that were available during my care of the patient were reviewed by me and considered in my medical decision making (see chart for details).      Final Clinical Impressions(s) / UC Diagnoses   Final diagnoses:  Urinary tract infection without hematuria, site unspecified     Discharge Instructions      Return if any problems.    ED Prescriptions     Medication Sig Dispense Auth. Provider   cephALEXin (KEFLEX) 500 MG capsule Take 1 capsule (500 mg total) by mouth 4 (four) times daily for 7 days. 28 capsule Elson Areas, [place]      PDMP not reviewed this encounter.    Elson Areas, [place] 10/17/20 1818

## 2020-10-19 LAB — URINE CULTURE: Culture: NO GROWTH

## 2020-11-16 ENCOUNTER — Emergency Department (HOSPITAL_COMMUNITY): Payer: Self-pay

## 2020-11-16 ENCOUNTER — Observation Stay (HOSPITAL_COMMUNITY)
Admission: EM | Admit: 2020-11-16 | Discharge: 2020-11-17 | Disposition: A | Discharge status: Home / Self Care | Payer: Self-pay | Attending: Pulmonary Disease | Admitting: Pulmonary Disease

## 2020-11-16 ENCOUNTER — Other Ambulatory Visit: Payer: Self-pay

## 2020-11-16 ENCOUNTER — Encounter (HOSPITAL_COMMUNITY): Payer: Self-pay

## 2020-11-16 DIAGNOSIS — T40601A Poisoning by unspecified narcotics, accidental (unintentional), initial encounter: Principal | ICD-10-CM | POA: Insufficient documentation

## 2020-11-16 DIAGNOSIS — Z79899 Other long term (current) drug therapy: Secondary | ICD-10-CM | POA: Insufficient documentation

## 2020-11-16 DIAGNOSIS — G929 Unspecified toxic encephalopathy: Secondary | ICD-10-CM | POA: Insufficient documentation

## 2020-11-16 DIAGNOSIS — J9601 Acute respiratory failure with hypoxia: Secondary | ICD-10-CM

## 2020-11-16 DIAGNOSIS — E876 Hypokalemia: Secondary | ICD-10-CM | POA: Insufficient documentation

## 2020-11-16 DIAGNOSIS — Z20822 Contact with and (suspected) exposure to covid-19: Secondary | ICD-10-CM | POA: Insufficient documentation

## 2020-11-16 DIAGNOSIS — Y900 Blood alcohol level of less than 20 mg/100 ml: Secondary | ICD-10-CM | POA: Insufficient documentation

## 2020-11-16 DIAGNOSIS — I129 Hypertensive chronic kidney disease with stage 1 through stage 4 chronic kidney disease, or unspecified chronic kidney disease: Secondary | ICD-10-CM | POA: Insufficient documentation

## 2020-11-16 DIAGNOSIS — E871 Hypo-osmolality and hyponatremia: Secondary | ICD-10-CM | POA: Insufficient documentation

## 2020-11-16 DIAGNOSIS — T50901A Poisoning by unspecified drugs, medicaments and biological substances, accidental (unintentional), initial encounter: Secondary | ICD-10-CM | POA: Diagnosis present

## 2020-11-16 DIAGNOSIS — J690 Pneumonitis due to inhalation of food and vomit: Secondary | ICD-10-CM | POA: Insufficient documentation

## 2020-11-16 DIAGNOSIS — F17210 Nicotine dependence, cigarettes, uncomplicated: Secondary | ICD-10-CM | POA: Insufficient documentation

## 2020-11-16 DIAGNOSIS — N182 Chronic kidney disease, stage 2 (mild): Secondary | ICD-10-CM | POA: Insufficient documentation

## 2020-11-16 LAB — CBC WITH DIFFERENTIAL/PLATELET
Abs Immature Granulocytes: 0.38 10*3/uL — ABNORMAL HIGH (ref 0.00–0.07)
Basophils Absolute: 0.1 10*3/uL (ref 0.0–0.1)
Basophils Relative: 0 %
Eosinophils Absolute: 0.1 10*3/uL (ref 0.0–0.5)
Eosinophils Relative: 0 %
HCT: 42.9 % (ref 39.0–52.0)
Hemoglobin: 14.6 g/dL (ref 13.0–17.0)
Immature Granulocytes: 1 %
Lymphocytes Relative: 4 %
Lymphs Abs: 1 10*3/uL (ref 0.7–4.0)
MCH: 32 pg (ref 26.0–34.0)
MCHC: 34 g/dL (ref 30.0–36.0)
MCV: 94.1 fL (ref 80.0–100.0)
Monocytes Absolute: 1.6 10*3/uL — ABNORMAL HIGH (ref 0.1–1.0)
Monocytes Relative: 6 %
Neutro Abs: 23.5 10*3/uL — ABNORMAL HIGH (ref 1.7–7.7)
Neutrophils Relative %: 89 %
Platelets: 278 10*3/uL (ref 150–400)
RBC: 4.56 MIL/uL (ref 4.22–5.81)
RDW: 11.9 % (ref 11.5–15.5)
WBC: 26.7 10*3/uL — ABNORMAL HIGH (ref 4.0–10.5)
nRBC: 0 % (ref 0.0–0.2)

## 2020-11-16 LAB — COMPREHENSIVE METABOLIC PANEL
ALT: 23 U/L (ref 0–44)
AST: 30 U/L (ref 15–41)
Albumin: 4.1 g/dL (ref 3.5–5.0)
Alkaline Phosphatase: 64 U/L (ref 38–126)
Anion gap: 12 (ref 5–15)
BUN: 13 mg/dL (ref 6–20)
CO2: 21 mmol/L — ABNORMAL LOW (ref 22–32)
Calcium: 8.9 mg/dL (ref 8.9–10.3)
Chloride: 105 mmol/L (ref 98–111)
Creatinine, Ser: 1.11 mg/dL (ref 0.61–1.24)
GFR, Estimated: >60 mL/min (ref >60)
Glucose, Bld: 82 mg/dL (ref 70–99)
Potassium: 4.2 mmol/L (ref 3.5–5.1)
Sodium: 138 mmol/L (ref 135–145)
Total Bilirubin: 0.6 mg/dL (ref 0.3–1.2)
Total Protein: 7.3 g/dL (ref 6.5–8.1)

## 2020-11-16 LAB — RAPID URINE DRUG SCREEN, HOSP PERFORMED
Amphetamines: NOT DETECTED
Barbiturates: NOT DETECTED
Benzodiazepines: NOT DETECTED
Cocaine: POSITIVE — AB
Opiates: NOT DETECTED
Tetrahydrocannabinol: POSITIVE — AB

## 2020-11-16 LAB — RESP PANEL BY RT-PCR (FLU A&B, COVID) ARPGX2
Influenza A by PCR: NEGATIVE
Influenza B by PCR: NEGATIVE
SARS Coronavirus 2 by RT PCR: NEGATIVE

## 2020-11-16 LAB — CBG MONITORING, ED: Glucose-Capillary: 117 mg/dL — ABNORMAL HIGH (ref 70–99)

## 2020-11-16 LAB — LACTIC ACID, PLASMA: Lactic Acid, Venous: 0.9 mmol/L (ref 0.5–1.9)

## 2020-11-16 LAB — GLUCOSE, CAPILLARY: Glucose-Capillary: 83 mg/dL (ref 70–99)

## 2020-11-16 LAB — ETHANOL: Alcohol, Ethyl (B): <10 mg/dL (ref <10)

## 2020-11-16 MED ORDER — POLYETHYLENE GLYCOL 3350 17 G PO PACK: 17.0000 g | PACK | Freq: Every day | ORAL | Status: DC | PRN

## 2020-11-16 MED ORDER — NALOXONE HCL 2 MG/2ML IJ SOSY
1.0000 mg | PREFILLED_SYRINGE | Freq: Once | INTRAMUSCULAR | Status: AC
  Administered 2020-11-16: 1 mg via INTRAVENOUS

## 2020-11-16 MED ORDER — ONDANSETRON HCL 4 MG/2ML IJ SOLN: 4.0000 mg | Freq: Four times a day (QID) | INTRAMUSCULAR | Status: DC | PRN

## 2020-11-16 MED ORDER — NALOXONE HCL 4 MG/10ML IJ SOLN
1.0000 mg/h | INTRAVENOUS | Status: DC
  Administered 2020-11-16: 1 mg/h via INTRAVENOUS
  Administered 2020-11-16: 0.5 mg/h via INTRAVENOUS
  Administered 2020-11-16: 1 mg/h via INTRAVENOUS
  Filled 2020-11-16 (×6): qty 10

## 2020-11-16 MED ORDER — NALOXONE HCL 2 MG/2ML IJ SOSY
PREFILLED_SYRINGE | INTRAMUSCULAR | Status: AC
  Filled 2020-11-16: qty 2

## 2020-11-16 MED ORDER — ENOXAPARIN SODIUM 40 MG/0.4ML IJ SOSY
40.0000 mg | PREFILLED_SYRINGE | INTRAMUSCULAR | Status: DC
  Administered 2020-11-16: 40 mg via SUBCUTANEOUS

## 2020-11-16 MED ORDER — SODIUM CHLORIDE 0.9 % IV SOLN
3.0000 g | Freq: Once | INTRAVENOUS | Status: AC
  Administered 2020-11-16: 3 g via INTRAVENOUS
  Filled 2020-11-16: qty 8

## 2020-11-16 MED ORDER — DOCUSATE SODIUM 100 MG PO CAPS: 100.0000 mg | ORAL_CAPSULE | Freq: Two times a day (BID) | ORAL | Status: DC | PRN

## 2020-11-16 NOTE — H&P (Signed)
NAME:  Mitchell Mcdonald, MRN:  [id number], DOB:  [birth year], LOS: 0 ADMISSION DATE:  11/16/2020, CONSULTATION DATE:  11/16/20 REFERRING MD:  Arby Barrette, MD CHIEF COMPLAINT:  Narcotic overdose  History of Present Illness:  30 year old male with history polysubstance abuse, depression, hypertension, polycystic kidney disease who presents for drug overdose after being found unresponsive by his girlfriend. EMS arrived and he was agonal and cyanotic. Given Narcan 1 mg with responsiveness. Was satting 90% and placed on 2L O2. He reports taking cocaine last night and percocet 30 mg this morning at 6:30 AM. Also was drinking alcohol last night. Denies intentional overdose. Denies shortness of breath, chest pain, cough, fever, chills. No known emesis, nausea or abdominal pain that he recalls. In the ED he became drowsy again. Narcan 1 mg was given with responsiveness. Narcan gtt was started. PCCM was consulted.  In the ED he is awake and alert on Narcan gtt. Labs significant for leukocytosis 26. CXR 11/16/20 no infiltrate, effusion or edema.  Pertinent  Medical History  Depression, hypertension, polycystic kidney disease, polysubstance abuse  Significant Hospital Events: Including procedures, antibiotic start and stop dates in addition to other pertinent events   11/12 Admit to PCCM for Narcan gtt for drug overdose  Interim History / Subjective:  As above  Objective   Blood pressure 105/68, pulse 84, temperature 97.8 F (36.6 C), temperature source Oral, resp. rate 12, height 5\' 11"  (1.803 m), weight 95.3 kg, SpO2 97 %.        Intake/Output Summary (Last 24 hours) at 11/16/2020 1441 Last data filed at 11/16/2020 1241 Gross per 24 hour  Intake 100 ml  Output --  Net 100 ml   Filed Weights   11/16/20 1018  Weight: 95.3 kg   Physical Exam: General: Well-appearing, no acute distress HENT: [place], AT, OP clear, MMM Eyes: EOMI, no scleral icterus Respiratory: Diminished bibasilar breath sounds.   No crackles, wheezing or rales Cardiovascular: RRR, -M/R/G, no JVD GI: BS+, soft, nontender Extremities:-Edema,-tenderness Neuro: AAO x4, CNII-XII grossly intact Skin: Intact, no rashes or bruising Psych: Normal mood, normal affect  Resolved Hospital Problem list     Assessment & Plan:   Acute toxic encephalopathy Narcotic overdose Ethanol <10 - Wean narcan gtt - Telemetry - Neuro checks - UDS pending  Acute hypoxemic respiratory failure 2/2 aspiration pneumonitis - S/p Unasyn x 1 dose - Continue supportive care - If clinical status worsens including worsening sepsis, will initiate antibiotics - F/u cultures - Wean supplemental O2 for goal >90% - Aspiration precautions - PRN zofran  Hypertension - low-normotensive  - Hold home meds  Polysubstance abuse - Social work for resources for substance abuse counseling/education  13/12/22 (right click and "Medical sales representative" daily)   Diet/type: clear liquids DVT prophylaxis: LMWH GI prophylaxis: N/A Lines: N/A Foley:  N/A Code Status:  full code Last date of multidisciplinary goals of care discussion [11/12] Patient confirmed full code  Labs   CBC: Recent Labs  Lab 11/16/20 1036  WBC 26.7*  NEUTROABS 23.5*  HGB 14.6  HCT 42.9  MCV 94.1  PLT 278    Basic Metabolic Panel: Recent Labs  Lab 11/16/20 1036  NA 138  K 4.2  CL 105  CO2 21*  GLUCOSE 82  BUN 13  CREATININE 1.11  CALCIUM 8.9   GFR: Estimated Creatinine Clearance: 116.7 mL/min (by C-G formula based on SCr of 1.11 mg/dL). Recent Labs  Lab 11/16/20 1036 11/16/20 1140  WBC 26.7*  --  LATICACIDVEN  --  0.9    Liver Function Tests: Recent Labs  Lab 11/16/20 1036  AST 30  ALT 23  ALKPHOS 64  BILITOT 0.6  PROT 7.3  ALBUMIN 4.1   No results for input(s): LIPASE, AMYLASE in the last 168 hours. No results for input(s): AMMONIA in the last 168 hours.  ABG    Component Value Date/Time   TCO2 22 07/08/2007 1543      Coagulation Profile: No results for input(s): INR, PROTIME in the last 168 hours.  Cardiac Enzymes: No results for input(s): CKTOTAL, CKMB, CKMBINDEX, TROPONINI in the last 168 hours.  HbA1C: No results found for: HGBA1C  CBG: Recent Labs  Lab 11/16/20 1022  GLUCAP 117*    Review of Systems:   Review of Systems  Constitutional:  Negative for chills, diaphoresis, fever, malaise/fatigue and weight loss.  HENT:  Negative for congestion.   Respiratory:  Negative for cough, hemoptysis, sputum production, shortness of breath and wheezing.   Cardiovascular:  Negative for chest pain, palpitations and leg swelling.  Gastrointestinal:  Negative for abdominal pain, diarrhea, heartburn, nausea and vomiting.  Neurological:  Negative for dizziness, weakness and headaches.    Past Medical History:  He,  has a past medical history of Depression, HTN (hypertension), Polycystic kidney disease, Polycystic kidney disease, Suicide attempt by drug ingestion (HCC), and Tobacco abuse.   Surgical History:  History reviewed. No pertinent surgical history.   Social History:   reports that he has been smoking cigarettes. He has been smoking an average of .5 packs per day. He has never used smokeless tobacco. He reports current alcohol use of about 2.0 - 3.0 standard drinks per week. He reports that he does not use drugs.   Family History:  His Family history is unknown by patient.   Allergies Allergies  Allergen Reactions   Ibuprofen Other (See Comments)    Kidney specialist advised him not to take it   Nsaids Other (See Comments)    Kidney specialist advised him not to take it      Home Medications  Prior to Admission medications   Medication Sig Start Date End Date Taking? Authorizing Provider  lisinopril (PRINIVIL,ZESTRIL) 5 MG tablet Take 1 tablet (5 mg total) by mouth daily. Patient not taking: No sig reported 05/02/16   Jimmy Footman, MD     Critical care time: 40  min    The patient is critically ill with multiple organ systems failure and requires high complexity decision making for assessment and support, frequent evaluation and titration of therapies, application of advanced monitoring technologies and extensive interpretation of multiple databases.    Mechele Collin, M.D. [facility] Pulmonary/Critical Care Medicine 11/16/2020 3:03 PM   Please see Amion for pager number to reach on-call Pulmonary and Critical Care Team.

## 2020-11-16 NOTE — Progress Notes (Signed)
Mitchell Mcdonald [id number] Admission Data: 11/16/2020 5:49 PM Attending Provider: Luciano Cutter, MD  WUJ:[id number], No Pcp Per (Inactive) Consults/ Treatment Team:   Mitchell Mcdonald is a 30 y.o. male patient admitted from ED awake, alert  & orientated  X 3,  Full Code, VSS - Blood pressure (!) 132/94, pulse 79, temperature 98.3 F (36.8 C), temperature source Oral, resp. rate 14, height 5\' 11"  (1.803 m), weight 95.3 kg, SpO2 93 %.,O2, room air. no c/o shortness of breath, no c/o chest pain, no distress noted. Tele # placed and pt is currently running:normal sinus rhythm.   IV site WDL:  antecubital right, condition patent and no redness and left, condition patent and no redness with a transparent dsg that's clean dry and intact.    Pt orientation to unit, room and routine. Information packet given to patient/family and safety video watched.  Admission INP armband ID verified with patient/family, and in place. SR up x 2, fall risk assessment complete with Patient and family verbalizing understanding of risks associated with falls. Pt verbalizes an understanding of how to use the call bell and to call for help before getting out of bed.  Skin, clean-dry- intact without evidence of bruising, or skin tears.   No evidence of skin break down noted on exam.     Will cont to monitor and assist as needed.  [id number], RN 11/16/2020 5:49 PM

## 2020-11-16 NOTE — ED Notes (Signed)
Pt became very drowsy and bradypneic without stimulation, EDP called to bedside, verbal order for 1mg  narcan IV given by Dr 

## 2020-11-16 NOTE — Progress Notes (Signed)
eLink Physician-Brief Progress Note Patient Name: Mitchell Mcdonald DOB: Mar 26, 1990 MRN: [id number]   Date of Service  11/16/2020  HPI/Events of Note  Recieved request for order to wean Narcan Patient seen awake and oriented as per bedside assessment Notes reviewed and plan is to wean Narcan  eICU Interventions  Nursing communication placed to wean Narcan as tolerated     Intervention Category Intermediate Interventions: Other:  Darl Pikes 11/16/2020, 9:50 PM

## 2020-11-16 NOTE — ED Triage Notes (Signed)
Pt bib gcems for overdose. Pts gf went to wake him up for work this morning and found him unresponsive in bed. When EMS arrived pt was agonal and cyanotic. 1mg  Iv narcan given and pt responded. Pt now alert and oriented, states he took what he thought was cocaine around 630 this morning.   90-93% on room air, 2l [place] applied Other VSS.  CBG 366  Hx of CKD
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
## 2020-11-16 NOTE — ED Provider Notes (Addendum)
[facility] EMERGENCY DEPARTMENT Provider Note   CSN: [id number] Arrival date & time: 11/16/20  1011     History Chief Complaint  Patient presents with   Drug Overdose    Mitchell Mcdonald is a 30 y.o. male.  Mitchell Mcdonald is a 30 y.o. male with a history of depression, hypertension, polycystic kidney disease, polysubstance abuse, who presents to the ED via EMS for evaluation after overdose.  Patient was found by his girlfriend when she went to wake him up for work and he was unresponsive in bed.  When EMS arrived patient was agonal and cyanotic but did have a pulse.  He was given 1 mg of IV Narcan and patient became responsive, but sats were in the low 90s so he was placed on 2 L nasal cannula.  Patient reports sometime during the night he did cocaine, this was the first time he had used from this batch of cocaine and is unsure if it could have been laced with something.  He also reports that he took a 30 mg tablet of oxycodone sometime in the early hours of morning, estimated around 6:30 AM.  He also endorses drinking alcohol overnight.  He denies any attempt to harm himself or any SI.  Reports currently he just feels tired and weak but denies any pain.  Denies shortness of breath.  Reports he has a chronic cough related to tobacco use.  No fevers or chills.  No abdominal pain, nausea, vomiting or diarrhea.  The history is provided by the patient and the EMS personnel.      Past Medical History:  Diagnosis Date   Depression    HTN (hypertension)    Polycystic kidney disease    Polycystic kidney disease    Suicide attempt by drug ingestion (HCC)    Tobacco abuse     Patient Active Problem List   Diagnosis Date Noted   Unspecified depressive disorder 04/29/2016   Cannabis use disorder, moderate, dependence (HCC) 04/29/2016   Overdose on Tylenol 04/27/2016   Tobacco abuse 04/27/2016   PKD (polycystic kidney disease) 12/23/2011   Hypertensive disorder 01/07/2011     History reviewed. No pertinent surgical history.     Family History  Family history unknown: Yes    Social History   Tobacco Use   Smoking status: Some Days    Packs/day: 0.50    Types: Cigarettes   Smokeless tobacco: Never  Substance Use Topics   Alcohol use: Yes    Alcohol/week: 2.0 - 3.0 standard drinks    Types: 2 - 3 Cans of beer per week    Comment: occassionaly, last drink over a week ago    Drug use: No    Home Medications Prior to Admission medications   Medication Sig Start Date End Date Taking? Authorizing Provider  lisinopril (PRINIVIL,ZESTRIL) 5 MG tablet Take 1 tablet (5 mg total) by mouth daily. 05/02/16   Jimmy Footman, MD    Allergies    Ibuprofen and Nsaids  Review of Systems   Review of Systems  Constitutional:  Positive for fatigue. Negative for chills and fever.  HENT: Negative.    Respiratory:  Positive for cough. Negative for shortness of breath.   Cardiovascular:  Negative for chest pain.  Gastrointestinal:  Negative for abdominal pain, diarrhea, nausea and vomiting.  Genitourinary:  Negative for dysuria.  Musculoskeletal:  Negative for myalgias.  Skin:  Negative for wound.  Neurological:  Positive for weakness (Generalized). Negative for headaches.  Psychiatric/Behavioral:  Negative for suicidal ideas.   All other systems reviewed and are negative.  Physical Exam Updated Vital Signs BP 111/69   Pulse 95   Temp 97.8 F (36.6 C) (Oral)   Resp 16   Ht 5\' 11"  (1.803 m)   Wt 95.3 kg   SpO2 93%   BMI 29.29 kg/m   Physical Exam Vitals and nursing note reviewed.  Constitutional:      General: He is not in acute distress.    Appearance: Normal appearance. He is well-developed. He is not diaphoretic.     Comments: Alert and responsive, able to answer questions, without stimulation patient starts to become more drowsy.  HENT:     Head: Normocephalic and atraumatic.     Nose: Nose normal.     Mouth/Throat:     Mouth:  Mucous membranes are moist.     Pharynx: Oropharynx is clear.  Eyes:     General:        Right eye: No discharge.        Left eye: No discharge.     Extraocular Movements: Extraocular movements intact.     Pupils: Pupils are equal, round, and reactive to light.     Comments: PERRLA  Cardiovascular:     Rate and Rhythm: Normal rate and regular rhythm.     Pulses: Normal pulses.     Heart sounds: Normal heart sounds.  Pulmonary:     Effort: No respiratory distress.     Breath sounds: Rales present. No wheezing.     Comments: While patient awake, alert, with respiratory rate of 16 he still had O2 sats in the mid 80s on room air with intermittent cough, sats improved on 2 L nasal cannula, focal crackles present in the left lower lobe, otherwise lungs are clear. Abdominal:     General: Bowel sounds are normal. There is no distension.     Palpations: Abdomen is soft. There is no mass.     Tenderness: There is no abdominal tenderness. There is no guarding.     Comments: Abdomen soft, nondistended, nontender to palpation in all quadrants without guarding or peritoneal signs  Musculoskeletal:        General: No deformity.     Cervical back: Neck supple.  Skin:    General: Skin is warm and dry.     Capillary Refill: Capillary refill takes less than 2 seconds.  Neurological:     Mental Status: He is alert and oriented to person, place, and time.     Coordination: Coordination normal.     Comments: Speech is clear, able to follow commands CN III-XII intact Normal strength in upper and lower extremities bilaterally including dorsiflexion and plantar flexion, strong and equal grip strength Sensation normal to light and sharp touch Moves extremities without ataxia, coordination intact  Psychiatric:        Mood and Affect: Mood normal. Affect is flat.        Behavior: Behavior normal.        Thought Content: Thought content does not include suicidal ideation.    ED Results / Procedures /  Treatments   Labs (all labs ordered are listed, but only abnormal results are displayed) Labs Reviewed  COMPREHENSIVE METABOLIC PANEL - Abnormal; Notable for the following components:      Result Value   CO2 21 (*)    All other components within normal limits  CBC WITH DIFFERENTIAL/PLATELET - Abnormal; Notable for the following components:   WBC 26.7 (*)  Neutro Abs 23.5 (*)    Monocytes Absolute 1.6 (*)    Abs Immature Granulocytes 0.38 (*)    All other components within normal limits  CBG MONITORING, ED - Abnormal; Notable for the following components:   Glucose-Capillary 117 (*)    All other components within normal limits  RESP PANEL BY RT-PCR (FLU A&B, COVID) ARPGX2  CULTURE, BLOOD (ROUTINE X 2)  CULTURE, BLOOD (ROUTINE X 2)  ETHANOL  RAPID URINE DRUG SCREEN, HOSP PERFORMED  LACTIC ACID, PLASMA  LACTIC ACID, PLASMA    EKG None  Radiology DG Chest Portable 1 View  Result Date: 11/16/2020 CLINICAL DATA:  Possible aspiration with drug overdose. EXAM: PORTABLE CHEST 1 VIEW COMPARISON:  September 21, 2014 FINDINGS: The heart size and mediastinal contours are within normal limits. Both lungs are clear. The visualized skeletal structures are unremarkable. IMPRESSION: No active disease. Electronically Signed   By: Sherian Rein M.D.   On: 11/16/2020 10:40    Procedures .Critical Care Performed by: Dartha Lodge, PA-C Authorized by: Dartha Lodge, PA-C   Critical care provider statement:    Critical care time (minutes):  45   Critical care time was exclusive of:  Separately billable procedures and treating other patients and teaching time   Critical care was necessary to treat or prevent imminent or life-threatening deterioration of the following conditions:  Respiratory failure (Narctoic overdose with resp failure)   Critical care was time spent personally by me on the following activities:  Development of treatment plan with patient or surrogate, discussions with  consultants, evaluation of patient's response to treatment, examination of patient, ordering and review of laboratory studies, ordering and review of radiographic studies, ordering and performing treatments and interventions, pulse oximetry, re-evaluation of patient's condition, review of old charts and obtaining history from patient or surrogate   I assumed direction of critical care for this patient from another provider in my specialty: no     Care discussed with: admitting provider     Medications Ordered in ED Medications  Ampicillin-Sulbactam (UNASYN) 3 g in sodium chloride 0.9 % 100 mL IVPB (3 g Intravenous New Bag/Given 11/16/20 1152)  naloxone HCl (NARCAN) 4 mg in dextrose 5 % 250 mL infusion (0.5 mg/hr Intravenous New Bag/Given 11/16/20 1215)  naloxone (NARCAN) injection 1 mg (1 mg Intravenous Given 11/16/20 1042)  naloxone (NARCAN) injection 1 mg (1 mg Intravenous Given 11/16/20 1148)    ED Course  I have reviewed the triage vital signs and the nursing notes.  Pertinent labs & imaging results that were available during my care of the patient were reviewed by me and considered in my medical decision making (see chart for details).    MDM Rules/Calculators/A&P                           30 y.o. male presents to the ED via EMS after overdose, was found cyanotic and agonal with EMS but with pulse, responded to 1 mg of IV Narcan, this involves an extensive number of treatment options, and is a complaint that carries with it a high risk of complications and morbidity.    On arrival pt alert and responsive with normal respiratory rate but despite this O2 sats remained in the mid 80s, this improved on 2.5 L nasal cannula.  Patient with focal rales in the left lung base, concern for potential aspiration given ongoing hypoxia even when patient is alert and responsive.  Additional history obtained  from patient's girlfriend who is now at bedside. Previous records obtained and reviewed via  EMR   Lab Tests:  I Ordered, reviewed, and interpreted labs, which included:  CBC: Significant leukocytosis of 26.7, normal hemoglobin CMP: CO2 of 21 but no other significant electrolyte derangements, normal renal and liver function Lactic acid and blood cultures pending. Glucose appropriate on arrival at 117  Imaging Studies ordered:  I ordered imaging studies which included chest x-ray, I independently visualized and interpreted imaging which shows a bit of haziness in the left lower lung concerning for developing aspiration pneumonia.  Radiologist did not call any infiltrates on that report.  ED Course:   10:40 AM called to bedside by nursing staff as patient started to become increasingly drowsy with bradypnea and some snoring respirations, given 1 mg Narcan IV and patient quickly responded and became alert and responsive once again.  He did not have worsening hypoxia at this time but remains on 2.5 L nasal cannula.  Given patient's leukocytosis and left lower lung crackles with some haziness on chest x-ray and continued hypoxia high clinical suspicion for aspiration pneumonia, will treat with IV Unasyn and feel patient will require admission.  11:45 AM  Patient again started to become drowsy with bradypnea and required an additional 1 mg dose of Narcan, patient will be placed on continuous Narcan infusion.  Will require admission to ICU.  1:18 PM Narcan drip increased to 1 mg/h due to increased drowsiness  ICU team was paged multiple times, delay in getting call back due to issues with their pager  I consulted critical care and discussed case with NP Kandice Robinsons and discussed lab and imaging findings, will see and admit patient to ICU   Portions of this note were generated with Dragon dictation software. Dictation errors may occur despite best attempts at proofreading.   Final Clinical Impression(s) / ED Diagnoses Final diagnoses:  Opiate overdose, accidental or unintentional,  initial encounter (HCC)  Aspiration pneumonia of left lower lobe, unspecified aspiration pneumonia type [facility])    Rx / DC Orders ED Discharge Orders     None        Dartha Lodge, PA-C 11/16/20 1456    Dartha Lodge, PA-C 11/16/20 1458    Arby Barrette, MD 11/21/20 [phone]

## 2020-11-17 DIAGNOSIS — Q613 Polycystic kidney, unspecified: Secondary | ICD-10-CM

## 2020-11-17 DIAGNOSIS — I10 Essential (primary) hypertension: Secondary | ICD-10-CM

## 2020-11-17 LAB — CBC
HCT: 38.2 % — ABNORMAL LOW (ref 39.0–52.0)
Hemoglobin: 13.4 g/dL (ref 13.0–17.0)
MCH: 32.1 pg (ref 26.0–34.0)
MCHC: 35.1 g/dL (ref 30.0–36.0)
MCV: 91.6 fL (ref 80.0–100.0)
Platelets: 224 10*3/uL (ref 150–400)
RBC: 4.17 MIL/uL — ABNORMAL LOW (ref 4.22–5.81)
RDW: 11.7 % (ref 11.5–15.5)
WBC: 7.3 10*3/uL (ref 4.0–10.5)
nRBC: 0 % (ref 0.0–0.2)

## 2020-11-17 LAB — BASIC METABOLIC PANEL
Anion gap: 8 (ref 5–15)
BUN: 9 mg/dL (ref 6–20)
CO2: 23 mmol/L (ref 22–32)
Calcium: 8.2 mg/dL — ABNORMAL LOW (ref 8.9–10.3)
Chloride: 101 mmol/L (ref 98–111)
Creatinine, Ser: 0.87 mg/dL (ref 0.61–1.24)
GFR, Estimated: >60 mL/min (ref >60)
Glucose, Bld: 90 mg/dL (ref 70–99)
Potassium: 3.3 mmol/L — ABNORMAL LOW (ref 3.5–5.1)
Sodium: 132 mmol/L — ABNORMAL LOW (ref 135–145)

## 2020-11-17 LAB — HIV ANTIBODY (ROUTINE TESTING W REFLEX): HIV Screen 4th Generation wRfx: NONREACTIVE

## 2020-11-17 MED ORDER — AMLODIPINE BESYLATE 5 MG PO TABS: 5.0000 mg | ORAL_TABLET | Freq: Every day | ORAL | 0 refills | Status: AC

## 2020-11-17 MED ORDER — POTASSIUM CHLORIDE CRYS ER 20 MEQ PO TBCR
40.0000 meq | EXTENDED_RELEASE_TABLET | ORAL | Status: DC
  Administered 2020-11-17: 40 meq via ORAL
  Filled 2020-11-17: qty 2

## 2020-11-17 MED ORDER — CHLORHEXIDINE GLUCONATE CLOTH 2 % EX PADS
6.0000 | MEDICATED_PAD | Freq: Every day | CUTANEOUS | Status: DC
  Administered 2020-11-16: 6 via TOPICAL

## 2020-11-17 NOTE — TOC Transition Note (Signed)
Transition of Care [facility]) - CM/SW Discharge Note   Patient Details  Name: Josimar Corning MRN: [id number] Date of Birth: 15-Dec-1990  [facility] of [facility]) CM/SW Contact:  Lawerance Sabal, RN Phone Number: 11/17/2020, 8:55 AM   Clinical Narrative:   Sherron Monday w patient over the phone. Discussed follow up care.  Informed him I will request CMA to schedule him an appointment at the [facility]. He understands that he will be getting a call from [place] with the appointment date and time. Verified cell phone number. Discussed medication barriers. Anticipate Dc on Norvasc. Patient texted [employer] app. Denies barriers obtaining meds.           Patient Goals and CMS Choice        Discharge Placement                       Discharge Plan and Services                                     Social Determinants of Health (SDOH) Interventions     Readmission Risk Interventions No flowsheet data found.

## 2020-11-17 NOTE — Discharge Summary (Signed)
Physician Discharge Summary  Patient ID: Mitchell Mcdonald MRN: [id number] DOB/AGE: [birth year] 28 y.o.  Admit date: 11/16/2020 Discharge date: 11/17/2020  Admission Diagnoses: opiate overdose, unintentional  Discharge Diagnoses:  Active Problems:   Drug overdose Polycystic kidney disease Chronic back pain Polysubstance abuse Chronic kidney disease stage II Hypokalemia Hypertension Hyponatremia  Discharged Condition: good  Hospital Course: Mitchell Mcdonald was admitted to the hospital and started on narcan infusion with appropriate response. He was able to be weaned off overnight. He remains stable this morning, tolerating PO intake, and is ready to go home. We discussed the risks of taking illicit drugs, especially large doses of opiates. I explained my concern for drug synergy when he is using multiple substances at once that may make doses he has taken previously more potent and dangers. He voiced understanding and is planning on getting an outpatient doctor to help manage his chronic kidney disease and PCKD-related back pain. A referral to [facility] wellness has been made and they will call this week with an appointment. I recommended getting OTC narcan and having it available in his home due to high risk medication use.   For his HTN he has previously been managed on ACEi. Due to infrequent follow up I would prefer to send him on amlodipine for now. At follow up lab monitoring and BP checks can be arranged.  Consults: [facility]- they will call him tomorrow with an appointment.  Significant Diagnostic Studies:  BMP Latest Ref Rng & Units 11/17/2020 11/16/2020 09/11/2020  Glucose 70 - 99 mg/dL 90 82 419(F)  BUN 6 - 20 mg/dL 9 13 16   Creatinine 0.61 - 1.24 mg/dL 7.90 2.40  Sodium 135 - 145 mmol/L 132(L) 138 137  Potassium 3.5 - 5.1 mmol/L 3.3(L) 4.2 4.4  Chloride 98 - 111 mmol/L 101 105 103  CO2 22 - 32 mmol/L 23 21(L) 26  Calcium 8.9 - 10.3 mg/dL 8.2(L)  8.9 9.6   CBC Latest Ref Rng & Units 11/17/2020 11/16/2020 09/11/2020  WBC 4.0 - 10.5 K/uL 7.3 26.7(H) 14.6(H)  Hemoglobin 13.0 - 17.0 g/dL 11/11/2020 53.2 99.2  Hematocrit 39.0 - 52.0 % 38.2(L) 42.9 47.9  Platelets 150 - 400 K/uL 224 278 302   UDS: THC, cocaine  Treatments:  Narcan infusion Electrolyte repletion  Supplemental oxygen  Discharge Exam: Blood pressure 132/81, pulse 63, temperature 97.7 F (36.5 C), temperature source Axillary, resp. rate 15, height 5\' 11"  (1.803 m), weight 94.3 kg, SpO2 95 %.  General: young man sitting up in bed in NAD HEENT: [place]/AT, eyes anicteric Cardio: S1S2, RRR Resp: breathing comfortably on RA, CTAB. Speaking in full sentences.  Abd: soft, NT Extremities: no c/c/e Derm: warm, dry, no rashes Neuro: awake, alert, moving all extremities, answering questions appropriately Psych: seems to have fair insight into his chronic medical conditions    Disposition:  home   Allergies as of 11/17/2020       Reactions   Ibuprofen Other (See Comments)   Kidney specialist advised him not to take it   Nsaids Other (See Comments)   Kidney specialist advised him not to take it         Medication List     STOP taking these medications    lisinopril 5 MG tablet Commonly known as: ZESTRIL       TAKE these medications    amLODipine 5 MG tablet Commonly known as: NORVASC Take 1 tablet (5 mg total) by mouth daily.  Follow-up [facility].   Why: Call monday to make an appointment and establish a primary care doctor Contact information: [place] [phone]                Signed: Julian Hy 11/17/2020, 8:49 AM

## 2020-11-17 NOTE — Progress Notes (Signed)
Patient discharge home, discharge instructions reviewed and patient verbalized understanding. IV removed and Angiocath intact. Patient received hand held prescription and Follow up appoint reviewed.

## 2020-11-17 NOTE — Progress Notes (Signed)
Pharmacy Electrolyte Replacement  Recent Labs:  Recent Labs    11/17/20 0542  K 3.3*  CREATININE 0.87    Low Critical Values (K </= 2.5, Phos </= 1, Mg </= 1) Present: None  MD Contacted:   Plan:  - K 3.3 - 40 mEq po q4h x 2 doses - Recheck K with AM labs  Thank you for allowing pharmacy to be a part of this patient's care.  Georgina Pillion, PharmD, BCPS Clinical Pharmacist Clinical phone for 11/17/2020: [phone] 11/17/2020 7:59 AM   **Pharmacist phone directory can now be found on [email or web address] ([email or web address]).  Listed under [facility].

## 2020-11-21 LAB — CULTURE, BLOOD (ROUTINE X 2)
Culture: NO GROWTH
Culture: NO GROWTH
Special Requests: ADEQUATE
Special Requests: ADEQUATE

## 2020-11-27 ENCOUNTER — Inpatient Hospital Stay (INDEPENDENT_AMBULATORY_CARE_PROVIDER_SITE_OTHER): Payer: Medicaid Other | Admitting: Primary Care

## 2021-04-09 ENCOUNTER — Ambulatory Visit (HOSPITAL_COMMUNITY): Payer: Self-pay

## 2021-05-26 ENCOUNTER — Ambulatory Visit (HOSPITAL_COMMUNITY): Payer: Self-pay

## 2021-05-30 ENCOUNTER — Encounter (HOSPITAL_COMMUNITY): Payer: Self-pay

## 2021-05-30 ENCOUNTER — Ambulatory Visit (HOSPITAL_COMMUNITY)
Admission: RE | Admit: 2021-05-30 | Discharge: 2021-05-30 | Disposition: A | Discharge status: Home / Self Care | Payer: Medicaid Other | Source: Ambulatory Visit | Attending: Internal Medicine | Admitting: Internal Medicine

## 2021-05-30 VITALS — BP 146/102 | HR 82 | Temp 99.3°F | Resp 17

## 2021-05-30 DIAGNOSIS — Z202 Contact with and (suspected) exposure to infections with a predominantly sexual mode of transmission: Secondary | ICD-10-CM | POA: Insufficient documentation
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
## 2021-05-30 NOTE — Discharge Instructions (Signed)
Your STI testing is pending today.  You will receive results in the next 2 to 3 days.  If you test positive for any STIs, you will receive a phone call from [place].  If you do not test positive, you will not receive a phone call from [place].  Please abstain from sexual intercourse until your STI testing comes back and wear condoms to prevent future spread of STIs.    If you develop any new or worsening symptoms or do not improve in the next 2 to 3 days, please return.  If your symptoms are severe, please go to the emergency room.  Follow-up with your primary care provider for further evaluation and management of your symptoms as well as ongoing wellness visits.  I hope you feel better!

## 2021-06-04 LAB — CYTOLOGY, (ORAL, ANAL, URETHRAL) ANCILLARY ONLY
Chlamydia: NEGATIVE
Comment: NEGATIVE
Comment: NORMAL
Neisseria Gonorrhea: NEGATIVE

## 2021-07-13 ENCOUNTER — Ambulatory Visit: Payer: Self-pay

## 2021-09-28 ENCOUNTER — Other Ambulatory Visit: Payer: Self-pay

## 2021-09-28 ENCOUNTER — Encounter (HOSPITAL_COMMUNITY): Payer: Self-pay

## 2021-09-28 ENCOUNTER — Ambulatory Visit (HOSPITAL_COMMUNITY)
Admission: RE | Admit: 2021-09-28 | Discharge: 2021-09-28 | Disposition: A | Discharge status: Home / Self Care | Payer: Medicaid Other | Source: Ambulatory Visit | Attending: Emergency Medicine | Admitting: Emergency Medicine

## 2021-09-28 VITALS — BP 133/90 | HR 88 | Temp 98.9°F | Resp 18

## 2021-09-28 DIAGNOSIS — M7918 Myalgia, other site: Secondary | ICD-10-CM

## 2021-09-28 DIAGNOSIS — T148XXA Other injury of unspecified body region, initial encounter: Secondary | ICD-10-CM

## 2021-09-28 LAB — POCT URINALYSIS DIPSTICK, ED / UC
Bilirubin Urine: NEGATIVE
Glucose, UA: NEGATIVE mg/dL
Ketones, ur: NEGATIVE mg/dL
Leukocytes,Ua: NEGATIVE
Nitrite: NEGATIVE
Protein, ur: NEGATIVE mg/dL
Specific Gravity, Urine: 1.02 (ref 1.005–1.030)
Urobilinogen, UA: 0.2 mg/dL (ref 0.0–1.0)
pH: 6.5 (ref 5.0–8.0)

## 2021-09-28 MED ORDER — ACETAMINOPHEN 325 MG PO TABS
650.0000 mg | ORAL_TABLET | Freq: Once | ORAL | Status: AC
  Administered 2021-09-28: 650 mg via ORAL

## 2021-09-28 MED ORDER — ACETAMINOPHEN 325 MG PO TABS
ORAL_TABLET | ORAL | Status: AC
  Filled 2021-09-28: qty 2

## 2021-09-28 NOTE — ED Provider Notes (Signed)
[name]    CSN: [id number] Arrival date & time: 09/28/21  1641     History   Chief Complaint Chief Complaint  Patient presents with   Abdominal Pain    Ive been having a sharp and dull achey pain in my lower left side stomach under my ribs. - Entered by patient    HPI Mitchell Mcdonald is a 31 y.o. male.  Presents with several hour history of left side pain. Reports it woke him from sleep this morning. 8/10 pain throughout the day.  He has not tried medications for his symptoms Denies any nausea, vomiting, diarrhea.  No abdominal pain.  Reports recent heavy lifting and moving Denies any blood in the urine or pain with urination  History of polycystic kidney disease  Past Medical History:  Diagnosis Date   Depression    HTN (hypertension)    Polycystic kidney disease    Polycystic kidney disease    Suicide attempt by drug ingestion ([place])    Tobacco abuse     Patient Active Problem List   Diagnosis Date Noted   Drug overdose 11/16/2020   Unspecified depressive disorder 04/29/2016   Cannabis use disorder, moderate, dependence ([place]) 04/29/2016   Overdose on Tylenol 04/27/2016   Tobacco abuse 04/27/2016   PKD (polycystic kidney disease) 12/23/2011   Hypertensive disorder 01/07/2011    History reviewed. No pertinent surgical history.     Home Medications    Prior to Admission medications   Medication Sig Start Date End Date Taking? Authorizing Provider  amLODipine (NORVASC) 5 MG tablet Take 1 tablet (5 mg total) by mouth daily. 11/17/20 11/17/21  Julian Hy, DO    Family History Family History  Family history unknown: Yes    Social History Social History   Tobacco Use   Smoking status: Some Days    Packs/day: 0.50    Types: Cigarettes   Smokeless tobacco: Never  Substance Use Topics   Alcohol use: Yes    Alcohol/week: 2.0 - 3.0 standard drinks of alcohol    Types: 2 - 3 Cans of beer per week    Comment: occassionaly, last drink over  a week ago    Drug use: No     Allergies   Ibuprofen and Nsaids   Review of Systems Review of Systems Per HPI  Physical Exam Triage Vital Signs ED Triage Vitals  Enc Vitals Group     BP 09/28/21 1704 (!) 133/90     Pulse Rate 09/28/21 1704 88     Resp 09/28/21 1704 18     Temp 09/28/21 1704 98.9 F (37.2 C)     Temp src --      SpO2 09/28/21 1704 97 %     Weight --      Height --      Head Circumference --      Peak Flow --      Pain Score 09/28/21 1705 8     Pain Loc --      Pain Edu? --      Excl. in [place]? --    No data found.  Updated Vital Signs BP (!) 133/90   Pulse 88   Temp 98.9 F (37.2 C)   Resp 18   SpO2 97%    Physical Exam Vitals and nursing note reviewed.  Constitutional:      Appearance: Normal appearance.  HENT:     Mouth/Throat:     Pharynx: Oropharynx is clear.  Cardiovascular:  Rate and Rhythm: Normal rate and regular rhythm.     Pulses: Normal pulses.     Heart sounds: Normal heart sounds.  Pulmonary:     Effort: Pulmonary effort is normal.     Breath sounds: Normal breath sounds.  Abdominal:     Palpations: There is no mass.     Tenderness: There is no abdominal tenderness. There is no right CVA tenderness, left CVA tenderness or guarding.  Musculoskeletal:        General: Tenderness present.     Comments: Some tenderness to palpation lower left side. No abdomen or back pain.   Skin:    Findings: No bruising, lesion or rash.  Neurological:     Mental Status: He is alert and oriented to person, place, and time.      UC Treatments / Results  Labs (all labs ordered are listed, but only abnormal results are displayed) Labs Reviewed  POCT URINALYSIS DIPSTICK, ED / UC - Abnormal; Notable for the following components:      Result Value   Hgb urine dipstick TRACE (*)    All other components within normal limits    EKG   Radiology No results found.  Procedures Procedures   Medications Ordered in UC Medications   acetaminophen (TYLENOL) tablet 650 mg (650 mg Oral Given 09/28/21 1750)    Initial Impression / Assessment and Plan / UC Course  I have reviewed the triage vital signs and the nursing notes.  Pertinent labs & imaging results that were available during my care of the patient were reviewed by me and considered in my medical decision making (see chart for details).  Urinalysis unremarkable apart from trace hemoglobin, although this has been present over the last year per chart review. Tylenol dose given for pain, improved to 5/10  Could be muscular given the recent heavy lifting and tenderness to palpation Does not have any abdominal, back, flank pain.  No pain radiating into the groin.  No urinary symptoms.  At this time recommend symptomatic care with Tylenol, ice/heat, lidocaine patch Return precautions discussed with strict ED precautions.  Patient agrees with plan  Final Clinical Impressions(s) / UC Diagnoses   Final diagnoses:  Myofascial pain on left side  Muscle strain     Discharge Instructions      I recommend using Tylenol as needed every 6 hours for pain. You can apply ice or hot pad to the area, whichever feels better. You could also try lidocaine patch to the area.  These can be left on for 12 hours at a time.  Please return to the urgent care if symptoms do not improve, or go to the emergency department if symptoms worsen.     ED Prescriptions   None    PDMP not reviewed this encounter.   Treston Coker, Lurena Joiner, [place] 09/28/21 1821

## 2021-09-28 NOTE — Discharge Instructions (Addendum)
I recommend using Tylenol as needed every 6 hours for pain. You can apply ice or hot pad to the area, whichever feels better. You could also try lidocaine patch to the area.  These can be left on for 12 hours at a time.  Please return to the urgent care if symptoms do not improve, or go to the emergency department if symptoms worsen.

## 2021-09-28 NOTE — ED Triage Notes (Signed)
Pt reports LLQ ABD pain that started this AM . No N/V/D. Pt has had a kidney stone in the past.

## 2021-09-30 ENCOUNTER — Ambulatory Visit (HOSPITAL_COMMUNITY): Payer: Self-pay

## 2021-11-20 ENCOUNTER — Ambulatory Visit (HOSPITAL_COMMUNITY): Payer: Self-pay

## 2021-11-21 ENCOUNTER — Ambulatory Visit: Payer: Self-pay

## 2021-11-24 ENCOUNTER — Ambulatory Visit (HOSPITAL_COMMUNITY)
Admission: RE | Admit: 2021-11-24 | Discharge: 2021-11-24 | Disposition: A | Discharge status: Home / Self Care | Payer: Medicaid Other | Source: Ambulatory Visit | Attending: Family Medicine | Admitting: Family Medicine

## 2021-11-24 ENCOUNTER — Encounter (HOSPITAL_COMMUNITY): Payer: Self-pay

## 2021-11-24 VITALS — BP 142/94 | HR 66 | Temp 98.2°F | Resp 16

## 2021-11-24 DIAGNOSIS — L209 Atopic dermatitis, unspecified: Secondary | ICD-10-CM

## 2021-11-24 MED ORDER — FLUOCINONIDE 0.05 % EX CREA: 1.0000 | TOPICAL_CREAM | Freq: Two times a day (BID) | CUTANEOUS | 0 refills | Status: AC

## 2021-11-24 NOTE — ED Triage Notes (Signed)
Chief Complaint:  "I think i have ring worm or shingles. I have red scaley patches on my stomach. It burns and itches" Burns when scratching, rash is spreading. On both flanks. No drainage. No new products.   Onset: On and off for years, comes up when it is clod outside.   OTC medications tried: Yes- Gold bond eczema cream, Aquaphor    with no relief  Sick exposure: Yes- Girlfriend had a rash on her butt crack a month ago.   New foods or medications: No  Recent Travel: No

## 2021-11-26 NOTE — ED Provider Notes (Signed)
[facility]   [id number] 11/24/21 Arrival Time: 1150  ASSESSMENT & PLAN:  1. Atopic dermatitis, unspecified type    No signs of bacterial skin infection. OTC symptom care as needed.  Discharge Medication List as of 11/24/2021  1:05 PM     START taking these medications   Details  fluocinonide cream (LIDEX) 0.05 % Apply 1 Application topically 2 (two) times daily., Starting Mon 11/24/2021, Normal         Follow-up Information     [facility].   Specialty: Urgent Care Why: If worsening or failing to improve as anticipated. Contact information: [address] [place] [address] [phone]                Reviewed expectations re: course of current medical issues. Questions answered. Outlined signs and symptoms indicating need for more acute intervention. Understanding verbalized. After Visit Summary given.   SUBJECTIVE: History from: Patient. Mitchell Mcdonald is a 31 y.o. male. Reports: Chief Complaint:  "I think i have ring worm or shingles. I have red scaley patches on my stomach. It burns and itches" Burns when scratching, rash is spreading. On both flanks. No drainage. No new products.   Onset: On and off for years, comes up when it is clod outside.   OTC medications tried: Yes- Gold bond eczema cream, Aquaphor    with no relief  Sick exposure: Yes- Girlfriend had a rash on her butt crack a month ago.   New foods or medications: No  Recent Travel: No   OBJECTIVE:  Vitals:   11/24/21 1243  BP: (!) 142/94  Pulse: 66  Resp: 16  Temp: 98.2 F (36.8 C)  TempSrc: Oral  SpO2: 98%    General appearance: alert; no distress Skin: warm and dry; approx 3x4 irregular splotchy patch of erythema over L mid abdomen; few smaller areas on trunk Neurologic: normal gait Psychological: alert and cooperative; normal mood and affect  Labs: Results for orders placed or performed during the hospital  encounter of 09/28/21  POC Urinalysis dipstick  Result Value Ref Range   Glucose, UA NEGATIVE NEGATIVE mg/dL   Bilirubin Urine NEGATIVE NEGATIVE   Ketones, ur NEGATIVE NEGATIVE mg/dL   Specific Gravity, Urine 1.020 1.005 - 1.030   Hgb urine dipstick TRACE (A) NEGATIVE   pH 6.5 5.0 - 8.0   Protein, ur NEGATIVE NEGATIVE mg/dL   Urobilinogen, UA 0.2 0.0 - 1.0 mg/dL   Nitrite NEGATIVE NEGATIVE   Leukocytes,Ua NEGATIVE NEGATIVE   Labs Reviewed - No data to display  Imaging: No results found.  Allergies  Allergen Reactions   Ibuprofen Other (See Comments)    Kidney specialist advised him not to take it   Nsaids Other (See Comments)    Kidney specialist advised him not to take it     Past Medical History:  Diagnosis Date   Depression    HTN (hypertension)    Polycystic kidney disease    Polycystic kidney disease    Suicide attempt by drug ingestion ([facility])    Tobacco abuse    Social History   Socioeconomic History   Marital status: Single    Spouse name: Not on file   Number of children: Not on file   Years of education: Not on file   Highest education level: Not on file  Occupational History   Not on file  Tobacco Use   Smoking status: Some Days    Packs/day: 0.50  Types: Cigarettes   Smokeless tobacco: Never  Substance and Sexual Activity   Alcohol use: Yes    Alcohol/week: 2.0 - 3.0 standard drinks of alcohol    Types: 2 - 3 Cans of beer per week    Comment: occassionaly, last drink over a week ago    Drug use: No   Sexual activity: Never  Other Topics Concern   Not on file  Social History Narrative   Not on file   Social Determinants of Health   Financial Resource Strain: Not on file  Food Insecurity: Not on file  Transportation Needs: Not on file  Physical Activity: Not on file  Stress: Not on file  Social Connections: Not on file  Intimate Partner Violence: Not on file   Family History  Family history unknown: Yes   History reviewed. No  pertinent surgical history.   Mardella Layman, MD 11/26/21 [phone]

## 2022-01-12 DIAGNOSIS — I10 Essential (primary) hypertension: Secondary | ICD-10-CM | POA: Diagnosis not present

## 2022-01-12 DIAGNOSIS — Q613 Polycystic kidney, unspecified: Secondary | ICD-10-CM | POA: Diagnosis not present

## 2022-01-12 DIAGNOSIS — Z79899 Other long term (current) drug therapy: Secondary | ICD-10-CM | POA: Diagnosis not present

## 2022-01-12 DIAGNOSIS — R809 Proteinuria, unspecified: Secondary | ICD-10-CM | POA: Diagnosis not present

## 2022-01-12 DIAGNOSIS — F17200 Nicotine dependence, unspecified, uncomplicated: Secondary | ICD-10-CM | POA: Diagnosis not present

## 2022-04-20 NOTE — Progress Notes (Signed)
MEDICAL GENETICS NEW PATIENT EVALUATION  Patient name: Mitchell Mcdonald DOB: 09-10-1990 Age: 32 y.o. MRN: [id number]  Referring Provider/Specialty: Beaulah Dinning, MD / [facility] Date of Evaluation: 04/23/2022 Chief Complaint/Reason for Referral: Polycystic kidney disease  HPI: Mitchell Mcdonald is a 32 y.o. male who presents today for an initial genetics evaluation for Polycystic kidney disease. He is accompanied by his partner at today's visit.  Mitchell Mcdonald was diagnosed with polycystic kidney disease at 32 yo after being evaluated for hypertension. He has been on enalapril to control blood pressures on and off since then- recently restarted. Mitchell Mcdonald was previously following with nephrology but did not have follow up for several years until October 2023 (Dr. Everardo All). In 2018 MRI showed [facility] staging class 1C with multiple cysts on both kidneys. MRI of abdomen in Nov 2023 showed class 1B staging; all other organs appeared normal without cysts. Repeat MRI is scheduled for 06/02/2022 and next nephrology appointment is 01/05/2023. Mitchell Mcdonald does not have a PCP. He has not had screening for cerebral aneurysms and has not seen a cardiologist. There is a history of smoking, alcohol use, and illicit drug use.  Prior genetic testing has not been performed, but was recently suggested by his nephrologist.  Past Medical History: Past Medical History:  Diagnosis Date   Depression    HTN (hypertension)    Polycystic kidney disease    Polycystic kidney disease    Suicide attempt by drug ingestion ([facility])    Tobacco abuse    Patient Active Problem List   Diagnosis Date Noted   Drug overdose 11/16/2020   Unspecified depressive disorder 04/29/2016   Cannabis use disorder, moderate, dependence ([facility]) 04/29/2016   Overdose on Tylenol 04/27/2016   Tobacco abuse 04/27/2016   PKD (polycystic kidney disease) 12/23/2011   Hypertensive disorder 01/07/2011    Past Surgical History:  Past Surgical  History:  Procedure Laterality Date   NO PAST SURGERIES     Social History: Limited family- father died at young age, did not have relationship with mother. Lived with grandmother who died approximately 1 year ago. History of illicit drug use and suicidal ideation.   Medications: Current Outpatient Medications on File Prior to Visit  Medication Sig Dispense Refill   enalapril (VASOTEC) 20 MG tablet Take 20 mg by mouth daily.     amLODipine (NORVASC) 5 MG tablet Take 1 tablet (5 mg total) by mouth daily. 30 tablet 0   enalapril (VASOTEC) 10 MG tablet Take 1 tablet by mouth daily. (Patient not taking: Reported on 04/23/2022)     fluocinonide cream (LIDEX) 0.05 % Apply 1 Application topically 2 (two) times daily. (Patient not taking: Reported on 04/23/2022) 60 g 0   No current facility-administered medications on file prior to visit.    Allergies:  Allergies  Allergen Reactions   Ibuprofen Other (See Comments)    Kidney specialist advised him not to take it   Nsaids Other (See Comments)    Kidney specialist advised him not to take it     Immunizations: Not assessed.  Review of Systems: General: no concerns. Eyes/vision: Eye floater recently that has been present for several weeks. Partner also stated she has noticed that Mitchell's pupils are sometimes unequal (one larger than the other). Ears/hearing: no concerns. Dental: root canal recently and cap.  Respiratory: no concerns. Cardiovascular: no concerns. Has never seen cardiology. Gastrointestinal: no concerns. Genitourinary: multiple bilateral renal cysts, first noticed at 32 years old during evaluation for hypertension. No other  extrarenal abdominal cysts. Hypertension- on enalapril. H/o kidney stone.  Endocrine: no concerns. Hematologic: no concerns. Immunologic: no concerns. Neurological: no concerns. Has not had screening for brain aneurysm. Psychiatric: no concerns. Musculoskeletal: no concerns. Skin, Hair, Nails: no  concerns. Black spot on arm first noticed a few years ago.   Family History: Notable family history: Limited family history available. May have half siblings. Father died at a young age. Did not have relationship with mother. Lived with grandmother who died approximately 1 year ago. Maternal grandfather died of renal failure. Unknown if anyone else in the family had renal cysts.  Mother's ethnicity: not assessed Father's ethnicity: not assessed Consanguinity: not assessed  Physical Examination: Weight: 101.2 kg Height: 5'9.88"; mid-parental unknown Head circumference: 60 cm  Ht 5' 9.88" (1.775 m)   Wt 223 lb (101.2 kg)   HC 60 cm (23.62")   BMI 32.11 kg/m   General: Alert, interactive Head: Normocephalic Eyes: Normoset, Normal lids, lashes, brows, no pupillary abnormalities Nose: Normal appearance Lips/Mouth: Normal appearance Ears: Normoset and normally formed, no pits, tags or creases Heart: Warm and well perfused Lungs: No increased work of breathing Neurologic: Normal gross motor and gait by observation, no abnormal movements Psych: Age-appropriate interactions Extremities: Symmetric and proportionate Hands/Feet: Normal hands; feet not examined  Prior Genetic testing: None  Pertinent Labs: Reviewed most recent studies from Care Everywhere 01/12/2022  Pertinent Imaging/Studies: MRI Kidney 2018 Right kidney measures: 12.1 x 5.8 cm (cc by AP) on sagittal images and 10.8 x 6.5 cm (cc by TR) on coronal images. Left kidney measures: 11.4 x 8.0 cm (CC by AP) on sagittal images and 11.4 x 7.5 cm (cc by TR) on coronal images.  There are multiple well-defined nonenhancing T1 hypointense and T2 hyperintense lesions off including sizes scattered in both kidneys suggestive of cysts. Index lesion on the left interpolar measuring 5.1 x 6 cm (series 23 image 43) with T1 hyperintense layering suggestive of proteinaceous/hemorrhagic content. Index lesion in the right lower pole  measuring 2.8 x 2.7 cm (23 image 53). A few of the cysts bilaterally shows T1 hyperintensity and T2 hypointensity representing proteinaceous/hemorrhagic cysts. No focal solid lesions.   MR ABDOMEN WITH AND WITHOUT CONTRAST, 11/25/2021 6:13 PM  INDICATION: NEOPLASM: RENAL/URETERAL, STAGING \ PCKD \ Q61.3 Polycystic kidney disease COMPARISON: Abdominal MRI 11/16/2016  TECHNIQUE: Multiplanar, multisequence MR images of the entire abdomen were obtained before and after intravenous administration of gadolinium-based contrast.  FINDINGS:  LOWER CHEST Heart: Normal size. No pericardial effusion. Lungs/pleura: No masses, consolidations, or effusions.  VESSELS: No thrombosis or aneurysm.  ABDOMEN Kidneys: . The right kidney measures 10.5 x 7.2 cm (CC by TV) on coronal images and 6.5 x 8.2 cm (AP by TV) on axial images. . The left kidney measures 10.8 x 7.6 cm (CC by TV) and coronal images and 7.5 x 8.7 cm (AP by TV) on axial images. . Please note that sagittal images are only acquired on the localizer sequence, and may not be representative. . Numerous bilateral cystic lesions of varying size and complexity. Some of these lesions demonstrate T1 hyperintensity and T2 hypointensity, representing proteinaceous/hemorrhagic cysts. The previously indexed left interpolar cyst has decreased in size. Now the largest cyst is in the left upper pole measuring 3.4 x 3.1 x 3.1 cm (series 8, image 24). . Focus of restricted diffusion in the left lower pole is associated with an area of T2 hypointensity and intrinsic T1 hyperintensity. No definite associated enhancement. (Series 6, image 102; series 5, image  26; and series 16, image 69).  Liver: Normal size without focal lesions. Gallbladder: Normal. No stones or inflammatory changes. Biliary: No obstruction. Spleen: Normal. Pancreas: Normal. Adrenals: Normal. Stomach/bowel: Unremarkable. Peritoneum: No masses or ascites. Nodes: No celiac or mesenteric  adenopathy. MSK: Marrow signal appropriate for age. No fractures, neoplasm, or destructive lesions. Additional comments: None.  CONCLUSION:  1. Numerous bilateral renal cysts in keeping with polycystic kidney disease. 2. Indeterminate focus of restricted diffusion in the left lower pole associated with an area of intrinsic T1 hyperintensity. No definite abnormal enhancement. Recommend short-term follow-up MRI in 6 months.    Next MRI will be 05/2022.  Assessment: Mitchell Mcdonald is a 32 y.o. male with childhood onset polycystic kidney disease (diagnosed at 32 years old during evaluation for hypertension). He has numerous bilateral renal cysts. No known extrarenal abdominal cysts (such as in the liver). He has well-controlled hypertension currently. He has never had formal cardiac evaluation or intracranial imaging. He also has some ophthalmologic symptoms -- floaters and unequal pupils sporadically.  Polycystic kidney disease is characterized by multiple cysts throughout the kidneys potentially resulting in complications such as renal failure, hypertension, kidney stones, and kidney cell carcinoma. Cysts can also be present in other organs, most commonly the liver, pancreas, vas deferens, and arachnoid membrane. Cardiovascular concerns may occur, including intracranial and other arterial aneurysms, dolichoectasia, aortic dilatation or dissection, and valve abnormalities. Given these concerns, there are specific surveillance guidelines that should be followed in diagnosed individuals, including: regular abdominal/renal imaging, urine studies, and blood pressure checks (typically every 1-5 years), and consideration of ECHO and MRA if other known risk factors. (See GeneReviews for full details- [email or web address]).  The majority of PKD is autosomal dominant (ADPKD), meaning a pathogenic variant in only one copy of the gene is sufficient to cause symptoms. There are several genes  associated with ADPKD- PKD1, PKD2, ALG5, ALG9, DNAJB11, GANAB, IFT140, others. There are also several genes that cause other conditions of which renal cysts are one feature. At this time, there is not an easily accessible panel that includes all of the ADPKD genes. As such, we recommend that Mitchell Mcdonald undergo testing through a polycystic kidney disease panel through [facility], which includes the most common PKD genes. If this panel is negative, further testing through whole exome sequencing could be considered. If a particular genetic cause of Mitchell Mcdonald's diagnosis of PKD can be identified, it may help guide management and treatment, determine prognosis and recurrence risk, and allow opportunities for research participation.  Recommendations: Polycystic kidney disease panel (GANAB, HNF1B, PKD1, PKD2, PKHD1, PRKCSH, TSC2) If negative, consider whole exome sequencing  A buccal sample was obtained during today's visit for the above genetic testing and sent to [facility]. Results are anticipated in 1 month. We will contact the patient to discuss results once available and arrange follow-up as needed.   Given his polycystic kidneys, he will also likely need other routine surveillance that includes intracranial imaging, cardiac imaging which we did not fully discuss today but plan to review at our next visit. Finding a potential genetic cause will help [place] to tailor these recommendations.   Charline Bills, MS, [facility] Certified Genetic Counselor  Loletha Grayer, D.O. Attending Physician, Medical [facility] Date: 05/06/2022 Time: 1:23pm   Total time spent: 45 minutes Time spent includes face to face and non-face to face care for the patient on the date of this encounter (history and physical, genetic counseling, coordination of care, data gathering and/or documentation as outlined)

## 2022-04-23 ENCOUNTER — Encounter (INDEPENDENT_AMBULATORY_CARE_PROVIDER_SITE_OTHER): Payer: Self-pay | Admitting: Pediatric Genetics

## 2022-04-23 ENCOUNTER — Ambulatory Visit (INDEPENDENT_AMBULATORY_CARE_PROVIDER_SITE_OTHER): Payer: Medicaid Other | Admitting: Pediatric Genetics

## 2022-04-23 VITALS — Ht 69.88 in | Wt 223.0 lb

## 2022-04-23 DIAGNOSIS — I10 Essential (primary) hypertension: Secondary | ICD-10-CM

## 2022-04-23 DIAGNOSIS — Q613 Polycystic kidney, unspecified: Secondary | ICD-10-CM | POA: Diagnosis not present

## 2022-05-06 NOTE — Patient Instructions (Signed)
At [facility], we are committed to providing exceptional care. You will receive a patient satisfaction survey through text or email regarding your visit today. Your opinion is important to me. Comments are appreciated.  Test ordered today: Polycystic kidney disease panel (GANAB, HNF1B, PKD1, PKD2, PKHD1, PRKCSH, TSC2) to [facility]

## 2022-07-08 DIAGNOSIS — Q613 Polycystic kidney, unspecified: Secondary | ICD-10-CM | POA: Diagnosis not present

## 2022-08-04 ENCOUNTER — Telehealth (INDEPENDENT_AMBULATORY_CARE_PROVIDER_SITE_OTHER): Payer: Self-pay | Admitting: Genetic Counselor

## 2022-08-04 NOTE — Telephone Encounter (Signed)
Spoke to Mitchell Mcdonald about result of [facility] Polycystic kidney disease panel. There were two variants of uncertain significance in PKD1. A.21308 A>G, p.([id number]) c.3785 A>G, p.([id number])   I explained that variants of uncertain significance (VUS) represent alterations in a gene that have not been seen with sufficient frequency to know with certainty whether or not they contribute to a specific cause of disease. Over time as more is learned about the variant, the lab will hopefully be able to classify the variant as either harmless (benign) or disease causing (pathogenic). My suspicion would be that at least one of these variants explains his diagnosis, though it is possible there could be a variant in a different gene that was not assessed by this test.  A follow up appt is scheduled for 8/20 at 1:30pm to review this result and additional recommendations related to PKD diagnosis.  Charline Bills, CGC

## 2022-08-21 ENCOUNTER — Telehealth (INDEPENDENT_AMBULATORY_CARE_PROVIDER_SITE_OTHER): Payer: Self-pay | Admitting: Genetic Counselor

## 2022-08-21 NOTE — Telephone Encounter (Signed)
Left voicemail requesting call back. If he returns call, please ask if he is able to move appointment with me to Thursday 8/22 at 3:30 so that he can also meet with the geneticist. If he is not able to do that, then leave current appointment as scheduled.  Charline Bills, CGC

## 2022-08-25 ENCOUNTER — Ambulatory Visit: Payer: Medicaid Other | Admitting: Genetic Counselor

## 2022-08-25 ENCOUNTER — Encounter (INDEPENDENT_AMBULATORY_CARE_PROVIDER_SITE_OTHER): Payer: Self-pay | Admitting: Genetic Counselor

## 2022-08-25 VITALS — Ht 70.39 in | Wt 229.4 lb

## 2022-08-25 DIAGNOSIS — Q613 Polycystic kidney, unspecified: Secondary | ICD-10-CM

## 2022-08-25 DIAGNOSIS — I10 Essential (primary) hypertension: Secondary | ICD-10-CM | POA: Diagnosis not present

## 2022-08-25 NOTE — Progress Notes (Unsigned)
MEDICAL GENETICS FOLLOW-UP VISIT  Patient name: Mitchell Mcdonald DOB: Jun 11, 1990 Age: 32 y.o. MRN: [id number]  Initial Referring Provider/Specialty: Beaulah Dinning, MD / [facility] Date of Evaluation: 08/25/2022 Chief Complaint/Reason for Referral: Polycystic kidney disease  HPI: Mitchell Mcdonald is a 32 y.o. male who presents today for follow-up with Genetics to review the results of genetic testing. He is accompanied by his partner at today's visit.  To review, his initial visit was on 04/23/2022 at 32 years old for genetic evaluation of polycystic kidney disease (PKD). [name] was diagnosed with PKD at 32 years old after being evaluated for hypertension with sporadic management since. Prior genetic testing had not been performed.  Since that visit, Mitchell Mcdonald has had an MRI of his abdomen on July 3rd, 2024.  The MRI found numerous bilateral cystic lesions of varying size and complexity, similar to previous findings related to PKD.  No cysts were observed on other organs.  Urinalysis performed on 01/12/2022 showed no evidence of progression of disease. Mitchell Mcdonald reported inconsistent use of blood pressure medication and management at this appointment.    A polycystic kidney disease panel was ordered and returned with two variants of unknown significance (VUS) in PKD1 (c.10387A>G and c.3785A>G) (results available in Labs, dated 04/23/2022). Given his clinical presentation, it is likely that one of these variants may be contributory to his PKD diagnosis.They return today to discuss these results.  Review of Systems (updates in bold): General: no concerns. Eyes/vision: Eye floater recently that has been present for several weeks. Partner reports no further incidents of unequal pupil sizes. Ears/hearing: no concerns. Dental: root canal recently and cap.  Respiratory: no concerns. Cardiovascular: no concerns. Has never seen cardiology. Gastrointestinal: no concerns. Genitourinary: multiple bilateral  renal cysts, first noticed at 32 years old during evaluation for hypertension. No other extrarenal abdominal cysts. Hypertension- on enalapril (inconsistent). H/o kidney stone.  Endocrine: no concerns. Hematologic: no concerns. Immunologic: no concerns. Neurological: no concerns. Has not had screening for brain aneurysm. Psychiatric: no concerns. Musculoskeletal: no concerns. Skin, Hair, Nails: no concerns. Black spot on arm first noticed a few years ago.   Family History: No updates to family history since last visit  Updated Genetic testing: [facility] Polycystic Kidney Disease Panel Report date: 05/27/2022, Accession: [id number] PKD1- c.10387 A>G (p.[id number]), variant of uncertain significance Observed in large population cohorts Not previously published as pathogenic or benign In silico analysis indicates does not alter protein structure/function PKD1- c.3785A>G (p.[id number]), variant of uncertain significance Identified in patients with polycystic kidney disease tested through [facility] Not observed at a significant frequency in large population cohorts Not previously published as pathogenic or benign In silico analysis supports deleterious effect on protein structure/function  Pertinent New Labs: Urinalysis with Microscopic IMPRESSION:   1. Polycystic kidney disease: No evidence of progression of renal disease currently. Patient has recently stage class II PKD, which means tolvaptan prophylaxis is not indicated currently. We have referred to medical genetics for further evaluation of polycystic kidney disease. Patient to schedule this. Also, registered for [place] controlled substance prescribing today in preparation.   2. Hypertension: improved, continue enalapril 10 mg daily. Encouraged checking blood pressures at home daily.   3. Tobacco abuse: Encouraged cessation. Provided 1 800 quit NOW information.   4. Electrolytes: acceptable   5. Proteinuria: minimal, with noted quantification  250mg /g.   6. Dialysis Earnestine Mealing Planning: none at this stage.  7. Abnormal MRI: aware, repeat pending in ~39mo.  followup in 1year.   Pertinent New Imaging/Studies:  MRI ABDOMEN W AND WO CONTRAST, 07/08/2022 2:00 PM  INDICATION: Polycystic kidney, unspecified \ Q61.3 Polycystic kidney, unspecified ADDITIONAL HISTORY: None. COMPARISON: MRI abdomen 11/25/2021.  TECHNIQUE: Multi-planar, multi-sequence MR images of the abdomen were obtained prior to and after intravenous administration of gadolinium-based contrast.  FINDINGS:  . Lower Chest: Within normal limits.  . Liver: Within normal limits. . Gallbladder/Biliary: Within normal limits. . Spleen: Within normal limits. . Pancreas: Within normal limits. . Adrenals: Within normal limits. . Kidneys: The right kidney measures 11.0 x 7.4 cm x 6.3 (CC x TV x AP). The left kidney measures 11. 6 x 7.6 x 7.2 cm (CC x TV x AP). Numerous bilateral cystic lesions of varying size and complexity. Some of these lesions demonstrate T1 hyperintensity and T2 hypointensity, representing proteinaceous/hemorrhagic cysts. Similar area of restricted diffusion in the peripheral left lower pole which corresponds with the hemorrhagic/proteinaceous cyst and does not demonstrate appreciable enhancement (6:30). A right lower pole lesion with apparent enhancement is favored related to artifact (701:131).  The venous and delayed phase postcontrast images are substantially degraded by motion artifact.  No definite arterially enhancing lesions.  . Peritoneum/Mesenteries/Extraperitoneum: No significant ascites or lymphadenopathy. . Gastrointestinal tract: No evidence of obstruction.  . Vascular: Within normal limits. . Musculoskeletal: Within normal limits.  IMPRESSION:  1. Similar findings of polycystic kidney disease with numerous complex bilateral renal cysts. 2. No apparent arterial enhancing lesion. 3. Previous area of restricted diffusion in the left  lower pole corresponds with a hemorrhagic/proteinaceous cyst.   Assessment: Mitchell Mcdonald is a 32 y.o. male with childhood onset polycystic kidney disease (PKD). He has numerous bilateral renal cysts and no known extrarenal cysts.  He has hypertension with sporadic management.  He has never had cardiac or neurological screening for PKD-associated cardiac and vascular concerns.  Recent genetic testing was significant for two VUS in PKD1 (c.10387A>G and c.3785A>G1). Given his clinical presentation, it is likely that one of these variants may explain his PKD diagnosis.  Polycystic kidney disease is characterized by multiple cysts throughout the kidneys potentially resulting in complications such as renal failure, hypertension, kidney stones, and kidney cell carcinoma. Cysts can also be present in other organs, most commonly the liver, pancreas, vas deferens, and arachnoid membrane. Cardiovascular concerns may occur, including intracranial and other arterial aneurysms, dolichoectasia, aortic dilatation or dissection, and valve abnormalities.   Given these concerns, there are specific surveillance guidelines that should be followed in diagnosed individuals, including: regular abdominal/renal imaging, urine studies, and blood pressure checks (typically every 1-5 years), and consideration of ECHO and MRA if other known risk factors. (See GeneReviews for full details- [email or web address]). Diesel's family history is limited, but there is no known family history of intracranial aneurysm or cardiac concerns. As such, screening for these concerns is not indicated unless symptoms arise in [place] or due to patient preference.  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 As such, the chance of Zebadiah's future children having PKD is estimated to be 50%. If these  variants are reclassified as pathogenic or benign, recurrence risk estimates may vary.  A copy of these results were provided to Jonny Ruiz and his partner, and will be faxed to his nephrologist Beaulah Dinning, MD. Results will be uploaded to Epic.  Recommendations:  Management of PKD includes the following: CT or MRI of abdomen and kidney ultrasound exam every 1-5  years. Blood pressure exam every 3 years. Urine studies for microalbuminuria and proteinuria every 1-5 years.  Additional screening for cardiac complications and intracranial aneurysms is based on family history and is not indicated for Santos at this time.  Skippy has a nephrology appointment scheduled for 01/05/2023.  Charline Bills, MS, [facility] Certified Genetic Counselor  Tilda Franco, [place] [facility] Genetic Counselor  Date: 08/26/2022 Time: 2:29 pm  Total time spent: 60 Time spent includes face to face and non-face to face care for the patient on the date of this encounter (history, genetic counseling, coordination of care, data gathering and/or documentation as outlined)

## 2022-08-26 NOTE — Patient Instructions (Signed)
At [facility], we are committed to providing exceptional care. You will receive a patient satisfaction survey through text or email regarding your visit today. Your opinion is important to me. Comments are appreciated.  

## 2022-10-09 DIAGNOSIS — R1032 Left lower quadrant pain: Secondary | ICD-10-CM | POA: Diagnosis not present

## 2023-01-06 ENCOUNTER — Other Ambulatory Visit: Payer: Self-pay

## 2023-01-06 ENCOUNTER — Encounter (HOSPITAL_COMMUNITY): Payer: Self-pay | Admitting: Emergency Medicine

## 2023-01-06 ENCOUNTER — Emergency Department (HOSPITAL_COMMUNITY)
Admission: EM | Admit: 2023-01-06 | Discharge: 2023-01-06 | Discharge status: Against Medical Advice | Payer: Medicaid Other | Attending: Emergency Medicine | Admitting: Emergency Medicine

## 2023-01-06 ENCOUNTER — Emergency Department (HOSPITAL_COMMUNITY): Payer: Medicaid Other

## 2023-01-06 DIAGNOSIS — Z5321 Procedure and treatment not carried out due to patient leaving prior to being seen by health care provider: Secondary | ICD-10-CM | POA: Insufficient documentation

## 2023-01-06 DIAGNOSIS — I10 Essential (primary) hypertension: Secondary | ICD-10-CM | POA: Insufficient documentation

## 2023-01-06 DIAGNOSIS — R0781 Pleurodynia: Secondary | ICD-10-CM | POA: Insufficient documentation

## 2023-01-06 DIAGNOSIS — R079 Chest pain, unspecified: Secondary | ICD-10-CM | POA: Diagnosis not present

## 2023-01-06 DIAGNOSIS — R0989 Other specified symptoms and signs involving the circulatory and respiratory systems: Secondary | ICD-10-CM | POA: Diagnosis not present

## 2023-01-06 DIAGNOSIS — R000 Tachycardia, unspecified: Secondary | ICD-10-CM | POA: Diagnosis not present

## 2023-01-06 LAB — BASIC METABOLIC PANEL
Anion gap: 11 (ref 5–15)
BUN: 14 mg/dL (ref 6–20)
CO2: 19 mmol/L — ABNORMAL LOW (ref 22–32)
Calcium: 8.8 mg/dL — ABNORMAL LOW (ref 8.9–10.3)
Chloride: 108 mmol/L (ref 98–111)
Creatinine, Ser: 0.8 mg/dL (ref 0.61–1.24)
GFR, Estimated: >60 mL/min (ref >60)
Glucose, Bld: 104 mg/dL — ABNORMAL HIGH (ref 70–99)
Potassium: 3.7 mmol/L (ref 3.5–5.1)
Sodium: 138 mmol/L (ref 135–145)

## 2023-01-06 LAB — CBC
HCT: 44.5 % (ref 39.0–52.0)
Hemoglobin: 16 g/dL (ref 13.0–17.0)
MCH: 31.9 pg (ref 26.0–34.0)
MCHC: 36 g/dL (ref 30.0–36.0)
MCV: 88.8 fL (ref 80.0–100.0)
Platelets: 331 10*3/uL (ref 150–400)
RBC: 5.01 MIL/uL (ref 4.22–5.81)
RDW: 12.4 % (ref 11.5–15.5)
WBC: 9.5 10*3/uL (ref 4.0–10.5)
nRBC: 0 % (ref 0.0–0.2)

## 2023-01-06 LAB — TROPONIN I (HIGH SENSITIVITY): Troponin I (High Sensitivity): 3 ng/L

## 2023-01-06 NOTE — ED Triage Notes (Signed)
 BIB [facility] c/o L ribcage pain after arguing w/ his girlfriend tonight. Reports he smoked weed & drank alcohol tonight as well.  Hx HTN off meds  fentanyl given en route.  20 LAC

## 2023-02-03 DIAGNOSIS — Q613 Polycystic kidney, unspecified: Secondary | ICD-10-CM | POA: Diagnosis not present
